# Patient Record
Sex: Male | Born: 1970 | Race: White | Marital: Single | Smoking: Current every day smoker
Health system: Southern US, Community
[De-identification: ages and names within clinical notes are randomized; demographics above are authoritative.]

---

## 2019-07-07 ENCOUNTER — Encounter: Payer: Self-pay | Admitting: Intensive Care

## 2019-07-07 ENCOUNTER — Emergency Department: Payer: BC Managed Care – PPO

## 2019-07-07 ENCOUNTER — Inpatient Hospital Stay
Admission: EM | Admit: 2019-07-07 | Discharge: 2019-07-10 | DRG: 390 | Disposition: A | Discharge status: Home / Self Care | Payer: BC Managed Care – PPO | Attending: Family Medicine | Admitting: Family Medicine

## 2019-07-07 ENCOUNTER — Other Ambulatory Visit: Payer: Self-pay

## 2019-07-07 DIAGNOSIS — Z20822 Contact with and (suspected) exposure to covid-19: Secondary | ICD-10-CM | POA: Diagnosis present

## 2019-07-07 DIAGNOSIS — K56609 Unspecified intestinal obstruction, unspecified as to partial versus complete obstruction: Principal | ICD-10-CM

## 2019-07-07 DIAGNOSIS — E876 Hypokalemia: Secondary | ICD-10-CM | POA: Diagnosis present

## 2019-07-07 DIAGNOSIS — R509 Fever, unspecified: Secondary | ICD-10-CM | POA: Diagnosis not present

## 2019-07-07 DIAGNOSIS — F17210 Nicotine dependence, cigarettes, uncomplicated: Secondary | ICD-10-CM | POA: Diagnosis present

## 2019-07-07 DIAGNOSIS — D72829 Elevated white blood cell count, unspecified: Secondary | ICD-10-CM | POA: Diagnosis present

## 2019-07-07 LAB — CBC
HCT: 49.9 % (ref 39.0–52.0)
Hemoglobin: 17 g/dL (ref 13.0–17.0)
MCH: 32.8 pg (ref 26.0–34.0)
MCHC: 34.1 g/dL (ref 30.0–36.0)
MCV: 96.1 fL (ref 80.0–100.0)
Platelets: 243 10*3/uL (ref 150–400)
RBC: 5.19 MIL/uL (ref 4.22–5.81)
RDW: 12 % (ref 11.5–15.5)
WBC: 2.7 10*3/uL — ABNORMAL LOW (ref 4.0–10.5)
nRBC: 0 % (ref 0.0–0.2)

## 2019-07-07 LAB — COMPREHENSIVE METABOLIC PANEL
ALT: 27 U/L (ref 0–44)
AST: 31 U/L (ref 15–41)
Albumin: 4 g/dL (ref 3.5–5.0)
Alkaline Phosphatase: 61 U/L (ref 38–126)
Anion gap: 12 (ref 5–15)
BUN: 18 mg/dL (ref 6–20)
CO2: 32 mmol/L (ref 22–32)
Calcium: 9.4 mg/dL (ref 8.9–10.3)
Chloride: 96 mmol/L — ABNORMAL LOW (ref 98–111)
Creatinine, Ser: 1 mg/dL (ref 0.61–1.24)
GFR calc Af Amer: >60 mL/min (ref >60)
GFR calc non Af Amer: >60 mL/min (ref >60)
Glucose, Bld: 145 mg/dL — ABNORMAL HIGH (ref 70–99)
Potassium: 3.8 mmol/L (ref 3.5–5.1)
Sodium: 140 mmol/L (ref 135–145)
Total Bilirubin: 0.8 mg/dL (ref 0.3–1.2)
Total Protein: 7.7 g/dL (ref 6.5–8.1)

## 2019-07-07 LAB — LIPASE, BLOOD: Lipase: 24 U/L (ref 11–51)

## 2019-07-07 MED ORDER — SODIUM CHLORIDE 0.9 % IV BOLUS
1000.0000 mL | Freq: Once | INTRAVENOUS | Status: AC
  Administered 2019-07-07: 1000 mL via INTRAVENOUS

## 2019-07-07 MED ORDER — ONDANSETRON 4 MG PO TBDP
4.0000 mg | ORAL_TABLET | Freq: Once | ORAL | Status: AC | PRN
  Administered 2019-07-07: 14:00:00 4 mg via ORAL
  Filled 2019-07-07: qty 1

## 2019-07-07 MED ORDER — LIDOCAINE VISCOUS HCL 2 % MT SOLN
15.0000 mL | Freq: Once | OROMUCOSAL | Status: AC
  Administered 2019-07-07: 15 mL via ORAL
  Filled 2019-07-07: qty 15

## 2019-07-07 MED ORDER — ACETAMINOPHEN 650 MG RE SUPP
325.0000 mg | Freq: Four times a day (QID) | RECTAL | Status: DC | PRN
  Filled 2019-07-07: qty 1

## 2019-07-07 MED ORDER — DEXTROSE-NACL 5-0.45 % IV SOLN: INTRAVENOUS | Status: DC

## 2019-07-07 MED ORDER — PANTOPRAZOLE SODIUM 40 MG IV SOLR
40.0000 mg | Freq: Two times a day (BID) | INTRAVENOUS | Status: DC
  Administered 2019-07-07 – 2019-07-10 (×6): 40 mg via INTRAVENOUS
  Filled 2019-07-07 (×6): qty 40

## 2019-07-07 MED ORDER — SODIUM CHLORIDE 0.9 % IV SOLN
INTRAVENOUS | Status: DC | PRN
  Administered 2019-07-07 – 2019-07-08 (×3): 250 mL via INTRAVENOUS

## 2019-07-07 MED ORDER — KETOROLAC TROMETHAMINE 30 MG/ML IJ SOLN
30.0000 mg | Freq: Four times a day (QID) | INTRAMUSCULAR | Status: DC | PRN
  Administered 2019-07-07 – 2019-07-08 (×2): 30 mg via INTRAVENOUS
  Filled 2019-07-07 (×2): qty 1

## 2019-07-07 MED ORDER — DICYCLOMINE HCL 10 MG PO CAPS
10.0000 mg | ORAL_CAPSULE | Freq: Once | ORAL | Status: AC
  Administered 2019-07-07: 10 mg via ORAL
  Filled 2019-07-07: qty 1

## 2019-07-07 MED ORDER — PIPERACILLIN-TAZOBACTAM 3.375 G IVPB
3.3750 g | Freq: Three times a day (TID) | INTRAVENOUS | Status: DC
  Administered 2019-07-07 – 2019-07-10 (×8): 3.375 g via INTRAVENOUS
  Filled 2019-07-07 (×9): qty 50

## 2019-07-07 MED ORDER — IOHEXOL 300 MG/ML  SOLN
100.0000 mL | Freq: Once | INTRAMUSCULAR | Status: AC | PRN
  Administered 2019-07-07: 100 mL via INTRAVENOUS

## 2019-07-07 MED ORDER — ENOXAPARIN SODIUM 40 MG/0.4ML ~~LOC~~ SOLN
40.0000 mg | SUBCUTANEOUS | Status: DC
  Administered 2019-07-07 – 2019-07-09 (×3): 40 mg via SUBCUTANEOUS
  Filled 2019-07-07 (×3): qty 0.4

## 2019-07-07 MED ORDER — ALUM & MAG HYDROXIDE-SIMETH 200-200-20 MG/5ML PO SUSP
30.0000 mL | Freq: Once | ORAL | Status: AC
  Administered 2019-07-07: 30 mL via ORAL
  Filled 2019-07-07: qty 30

## 2019-07-07 NOTE — H&P (Addendum)
History and Physical    Amjad Fikes SFK:[id number] DOB: [birth year] DOA: 07/07/2019  PCP: System, Pcp Not In (Confirm with patient/family/NH records and if not entered, this has to be entered at [facility] point of entry) Patient coming from: home  I have personally briefly reviewed patient's old medical records in [facility]  Chief Complaint: Abdominal pain, N/V  HPI: Julian Jennings is a 49 y.o. male with medical history significant of  presents the emergency department complaining of sharp midabdominal/periumbilical pain.  Patient states that the pain is intense, the worst he has ever had in his abdomen.  Patient states that he has been extremely nauseated, having emesis.  He has not been able to keep solids or liquids down in 3 days.  Patient states that he is having no flatulence or bowel movements in the last 4 days.  No history of chronic abdominal problems.  No history of colitis, Crohn's, IBS. No history of abdominal surgery or injury.  Emesis is nonbilious.  No identified blood. (For level 3, the HPI must include 4+ descriptors: Location, Quality, Severity, Duration, Timing, Context, modifying factors, associated signs/symptoms and/or status of 3+ chronic problems.)  (Please avoid self-populating past medical history here) (The initial 2-3 lines should be focused and good to copy and paste in the HPI section of the daily progress note).  ED Course: Hemodynamically stable. Labs unremarkable. Imaging reveals SBO distal small bowel right. NG placed with some relief of pain and distention. Surgical consult obtained. Patient referred to [facility] for medical management and admission  Review of Systems: As per HPI otherwise 10 point review of systems negative. Has had hiccups for several days. Unacceptable ROS statements: "10 systems reviewed," "Extensive" (without elaboration).  Acceptable ROS statements: "All others negative," "All others reviewed and are negative," and "All others unremarkable," with at  LEAST ONE ROS documented Can't double dip - if using for HPI can't use for ROS  History reviewed. No pertinent past medical history.  History reviewed. No pertinent surgical history.  Social Hx - Lives in [place]. 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 Has a brother in [place]   reports that he has been smoking cigarettes. His smokeless tobacco use includes chew. He reports previous alcohol use. He reports current drug use. Drug: Marijuana. Was smoking 12 ppd but has not smoked since coming to [place] a week ago. Drank 6-pack a day but has not had a drink for many days. Never had any problems with alcohol No Known Allergies  History reviewed. No pertinent family history. Unacceptable: Noncontributory, unremarkable, or negative. Acceptable: Family history reviewed and not pertinent (If you reviewed it)  Prior to Admission medications   Not on File    Physical Exam: Vitals:   07/07/19 1600 07/07/19 1700 07/07/19 1800 07/07/19 1927  BP: (!) 159/96 (!) 155/104 (!) 165/102 (!) 150/94  Pulse: 91 (!) 106 (!) 109 96  Resp:  17 18 14   Temp:      TempSrc:      SpO2: 98% 100% 98% 97%  Weight:      Height:        Constitutional: NAD, calm, comfortable Vitals:   07/07/19 1600 07/07/19 1700 07/07/19 1800 07/07/19 1927  BP: (!) 159/96 (!) 155/104 (!) 165/102 (!) 150/94  Pulse: 91 (!) 106 (!) 109 96  Resp:  17 18 14   Temp:      TempSrc:      SpO2: 98% 100% 98% 97%  Weight:  Height:       General  - WNWD man in no distress at time of exam. Eyes: PERRL, lids and conjunctivae normal ENMT: Mucous membranes are moist. Posterior pharynx clear of any exudate or lesions.Normal dentition.  Neck: normal, supple, no masses, no thyromegaly Respiratory: clear to auscultation bilaterally, no wheezing, no crackles. Normal respiratory effort. No accessory muscle use.  Cardiovascular: Regular rate and rhythm, no murmurs / rubs / gallops. No extremity edema. 2+ pedal pulses. No  carotid bruits.  Abdomen: Absent BS, mild diffuse  tenderness, no masses palpated. No hepatosplenomegaly. Musculoskeletal: no clubbing / cyanosis. No joint deformity upper and lower extremities. Good ROM, no contractures. Normal muscle tone.  Skin: no rashes, lesions, ulcers. No induration Neurologic: CN 2-12 grossly intact. Sensation intact, DTR normal. Strength 5/5 in all 4.  Psychiatric: Normal judgment and insight. Alert and oriented x 3. Normal mood.   (Anything < 9 systems with 2 bullets each down codes to level 1) (If patient refuses exam can't bill higher level) (Make sure to document decubitus ulcers present on admission -- if possible -- and whether patient has chronic indwelling catheter at time of admission)  Labs on Admission: I have personally reviewed following labs and imaging studies  CBC: Recent Labs  Lab 07/07/19 1410  WBC 2.7*  HGB 17.0  HCT 49.9  MCV 96.1  PLT 007   Basic Metabolic Panel: Recent Labs  Lab 07/07/19 1410  NA 140  K 3.8  CL 96*  CO2 32  GLUCOSE 145*  BUN 18  CREATININE 1.00  CALCIUM 9.4   GFR: Estimated Creatinine Clearance: 74 mL/min (by C-G formula based on SCr of 1 mg/dL). Liver Function Tests: Recent Labs  Lab 07/07/19 1410  AST 31  ALT 27  ALKPHOS 61  BILITOT 0.8  PROT 7.7  ALBUMIN 4.0   Recent Labs  Lab 07/07/19 1410  LIPASE 24   No results for input(s): AMMONIA in the last 168 hours. Coagulation Profile: No results for input(s): INR, PROTIME in the last 168 hours. Cardiac Enzymes: No results for input(s): CKTOTAL, CKMB, CKMBINDEX, TROPONINI in the last 168 hours. BNP (last 3 results) No results for input(s): PROBNP in the last 8760 hours. HbA1C: No results for input(s): HGBA1C in the last 72 hours. CBG: No results for input(s): GLUCAP in the last 168 hours. Lipid Profile: No results for input(s): CHOL, HDL, LDLCALC, TRIG, CHOLHDL, LDLDIRECT in the last 72 hours. Thyroid Function Tests: No results for  input(s): TSH, T4TOTAL, FREET4, T3FREE, THYROIDAB in the last 72 hours. Anemia Panel: No results for input(s): VITAMINB12, FOLATE, FERRITIN, TIBC, IRON, RETICCTPCT in the last 72 hours. Urine analysis: No results found for: COLORURINE, APPEARANCEUR, Anderson, Fallon, GLUCOSEU, HGBUR, BILIRUBINUR, KETONESUR, PROTEINUR, UROBILINOGEN, NITRITE, LEUKOCYTESUR  Radiological Exams on Admission: DG Abdomen 1 View  Result Date: 07/07/2019 CLINICAL DATA:  NG tube placement EXAM: ABDOMEN - 1 VIEW COMPARISON:  None. FINDINGS: The NG tube projects over the left upper quadrant in the region of the patient's gastric body or fundus. Dilated loops of small bowel are again noted consistent with the patient's small-bowel obstruction. IMPRESSION: 1. NG tube tip projects over the gastric body or fundus. 2. Small bowel obstruction. Electronically Signed   By: Constance Holster M.D.   On: 07/07/2019 19:25   CT ABDOMEN PELVIS W CONTRAST  Result Date: 07/07/2019 CLINICAL DATA:  Sharp abdominal pain and nausea with vomiting for 3 days. EXAM: CT ABDOMEN AND PELVIS WITH CONTRAST TECHNIQUE: Multidetector CT imaging of the abdomen  and pelvis was performed using the standard protocol following bolus administration of intravenous contrast. CONTRAST:  OMNIPAQUE IOHEXOL 300 MG/ML  SOLN COMPARISON:  None. FINDINGS: Lower Chest: No acute findings. Hepatobiliary: Tiny sub-cm low-attenuation lesion in the in medial segment of left lobe is too small to characterize but most likely represents a tiny cyst. No other liver lesions identified. Gallbladder is unremarkable. No evidence of biliary ductal dilatation. Pancreas:  No mass or inflammatory changes. Spleen: Within normal limits in size and appearance. Adrenals/Urinary Tract: No masses identified. Mild left renal parenchymal scarring noted. No evidence of hydronephrosis. Stomach/Bowel: Multiple moderately dilated fluid-filled small bowel loops are seen with transition point in the  right lower quadrant, consistent with distal small bowel obstruction. Mild small bowel wall thickening and enhancement in the region of the transition point is consistent with enteritis. This may be due to stricture or adhesion. No evidence of abscess. Minimal free fluid seen in pelvic cul-de-sac. Vascular/Lymphatic: No pathologically enlarged lymph nodes. Shotty sub-cm mesenteric lymph nodes in the right abdomen are likely reactive in etiology. No abdominal aortic aneurysm. Reproductive:  No mass or other significant abnormality. Other:  None. Musculoskeletal:  No suspicious bone lesions identified. IMPRESSION: 1. Distal small bowel obstruction, with transition point in the right lower quadrant. This may be due to stricture or adhesion. 2. Minimal free fluid in pelvic cul-de-sac. No evidence of abscess. Electronically Signed   By: Danae Orleans M.D.   On: 07/07/2019 17:08    EKG: Independently reviewed. No EKG on chart  Assessment/Plan Active Problems:   SBO (small bowel obstruction) (HCC)  (please populate well all problems here in Problem List. (For example, if patient is on BP meds at home and you resume or decide to hold them, it is a problem that needs to be her. Same for CAD, COPD, HLD and so on)   1. SBO - sponataneous SBO. No prior surgery, no GI history, no injury. He is basically a healthy man. Plan Med-surg admit  NPO  NG to low suction  IV PPI - for hiccups and stress ulcer prevention  IVF - D5 1/2NS  Ketorolac for pain mgt  Surgery to follow Addendum: patient developed Fever to 101.2 F. On reexam he had increased tenderness with guarding. Plan APAP suppository 325 mg q6 for fever  Zosyn 3.375mg  IV q 6 for potential intra-abdominal infection  DVT prophylaxis: lovenox3 (Lovenox/Heparin/SCD's/anticoagulated/None (if comfort care) Code Status: full code (Full/Partial (specify details) Family Communication: Brother Trey Paula [phone] - not available at time of admission (Specify name,  relationship. Do not write "discussed with patient". Specify tel # if discussed over the phone) Disposition Plan: home when medically stable (specify when and where you expect patient to be discharged) Consults called:  Surgery - Dr. Tonna Boehringer (with names) Admission status: inpatient (inpatient / obs / tele / medical floor / SDU)   Illene Regulus MD [facility] Pager [phone]  If 7PM-7AM, please contact night-coverage [email or web address] Password [facility]  07/07/2019, 8:37 PM

## 2019-07-07 NOTE — ED Notes (Signed)
Hospitalist at bedside 

## 2019-07-07 NOTE — ED Triage Notes (Signed)
Patient c/o abd cramping and emesis X3 days. Denies diarrhea.

## 2019-07-07 NOTE — Consult Note (Signed)
Subjective:   CC: Small bowel obstruction  HPI:  Julian Jennings is a 49 y.o. male who was consulted for issue above.  Symptoms were first noted 3 days ago. Pain is sharp, around the periumbilical area, improving now but still present..  Associated with nausea vomiting with obstipation, exacerbated by nothing specific.   Past Medical History: none reported  Past Surgical History: None  Family History: non-contributory  Social History:  reports that he has been smoking cigarettes. His smokeless tobacco use includes chew. He reports previous alcohol use. He reports current drug use. Drug: Marijuana.  Current Medications: none reported  Allergies:  Allergies as of 07/07/2019  . (No Known Allergies)    ROS:  General: Denies weight loss, weight gain, fatigue, fevers, chills, and night sweats. Eyes: Denies blurry vision, double vision, eye pain, itchy eyes, and tearing. Ears: Denies hearing loss, earache, and ringing in ears. Nose: Denies sinus pain, congestion, infections, runny nose, and nosebleeds. Mouth/throat: Denies hoarseness, sore throat, bleeding gums, and difficulty swallowing. Heart: Denies chest pain, palpitations, racing heart, irregular heartbeat, leg pain or swelling, and decreased activity tolerance. Respiratory: Denies breathing difficulty, shortness of breath, wheezing, cough, and sputum. GI: Denies change in appetite, heartburn, diarrhea, and blood in stool. GU: Denies difficulty urinating, pain with urinating, urgency, frequency, blood in urine. Musculoskeletal: Denies joint stiffness, pain, swelling, muscle weakness. Skin: Denies rash, itching, mass, tumors, sores, and boils Neurologic: Denies headache, fainting, dizziness, seizures, numbness, and tingling. Psychiatric: Denies depression, anxiety, difficulty sleeping, and memory loss. Endocrine: Denies heat or cold intolerance, and increased thirst or urination. Blood/lymph: Denies easy bruising, easy bruising, and  swollen glands     Objective:     BP (!) 155/104   Pulse (!) 106   Temp 99.8 F (37.7 C) (Oral)   Resp 17   Ht 5\' 4"  (1.626 m)   Wt 63.5 kg   SpO2 100%   BMI 24.03 kg/m   Constitutional :  alert, cooperative, appears stated age and no distress  Lymphatics/Throat:  no asymmetry, masses, or scars  Respiratory:  clear to auscultation bilaterally  Cardiovascular:  regular rate and rhythm  Gastrointestinal: soft, no guarding, some TTP RLQ.    Musculoskeletal: Steady movement  Skin: Cool and moist   Psychiatric: Normal affect, non-agitated, not confused       LABS:  CMP Latest Ref Rng & Units 07/07/2019  Glucose 70 - 99 mg/dL 07/09/2019)  BUN 6 - 20 mg/dL 18  Creatinine 409(W - 1.19 mg/dL 1.47  Sodium 8.29 - 562 mmol/L 140  Potassium 3.5 - 5.1 mmol/L 3.8  Chloride 98 - 111 mmol/L 96(L)  CO2 22 - 32 mmol/L 32  Calcium 8.9 - 10.3 mg/dL 9.4  Total Protein 6.5 - 8.1 g/dL 7.7  Total Bilirubin 0.3 - 1.2 mg/dL 0.8  Alkaline Phos 38 - 126 U/L 61  AST 15 - 41 U/L 31  ALT 0 - 44 U/L 27   CBC Latest Ref Rng & Units 07/07/2019  WBC 4.0 - 10.5 K/uL 2.7(L)  Hemoglobin 13.0 - 17.0 g/dL 07/09/2019  Hematocrit 86.5 - 52.0 % 49.9  Platelets 150 - 400 K/uL 243    RADS: CLINICAL DATA:  Sharp abdominal pain and nausea with vomiting for 3 days.  EXAM: CT ABDOMEN AND PELVIS WITH CONTRAST  TECHNIQUE: Multidetector CT imaging of the abdomen and pelvis was performed using the standard protocol following bolus administration of intravenous contrast.  CONTRAST:  78.4 OMNIPAQUE IOHEXOL 300 MG/ML  SOLN  COMPARISON:  None.  FINDINGS: Lower Chest: No acute findings.  Hepatobiliary: Tiny sub-cm low-attenuation lesion in the in medial segment of left lobe is too small to characterize but most likely represents a tiny cyst. No other liver lesions identified. Gallbladder is unremarkable. No evidence of biliary ductal dilatation.  Pancreas:  No mass or inflammatory changes.  Spleen:  Within normal limits in size and appearance.  Adrenals/Urinary Tract: No masses identified. Mild left renal parenchymal scarring noted. No evidence of hydronephrosis.  Stomach/Bowel: Multiple moderately dilated fluid-filled small bowel loops are seen with transition point in the right lower quadrant, consistent with distal small bowel obstruction. Mild small bowel wall thickening and enhancement in the region of the transition point is consistent with enteritis. This may be due to stricture or adhesion. No evidence of abscess. Minimal free fluid seen in pelvic cul-de-sac.  Vascular/Lymphatic: No pathologically enlarged lymph nodes. Shotty sub-cm mesenteric lymph nodes in the right abdomen are likely reactive in etiology. No abdominal aortic aneurysm.  Reproductive:  No mass or other significant abnormality.  Other:  None.  Musculoskeletal:  No suspicious bone lesions identified.  IMPRESSION: 1. Distal small bowel obstruction, with transition point in the right lower quadrant. This may be due to stricture or adhesion. 2. Minimal free fluid in pelvic cul-de-sac. No evidence of abscess.   Electronically Signed   By: Marlaine Hind M.D.   On: 07/07/2019 17:08 Assessment:   Small bowel obstruction  Plan:   Patient vitals stable, looking relatively comfortable on exam unsure what exact cause of the obstruction is since patient does not have any abdominal surgical history.  Discussed CT scan with reading radiologist and at this point CT does not have any signs of a possible mass as cause of obstruction.  For now we will treat him symptomatically.  Recommend NG tube decompression, and small bowel obstruction protocol with a small bowel follow-through to see if symptoms resolve.  Patient may need potential work-up down the road to further look into the cause of this obstruction.  Thank you for this consultation.

## 2019-07-07 NOTE — ED Provider Notes (Signed)
[facility] Emergency Department Provider Note  ____________________________________________  Time seen: Approximately 3:22 PM  I have reviewed the triage vital signs and the nursing notes.   HISTORY  Chief Complaint Abdominal Pain and Emesis    HPI Julian Jennings is a 49 y.o. male who presents the emergency department complaining of sharp midabdominal/periumbilical pain.  Patient states that the pain is intense, the worst he has ever had in his abdomen.  Patient states that he has been extremely nauseated, having emesis.  He has not been able to keep solids or liquids down in 3 days.  Patient states that he is having no flatulence or bowel movements in the last 4 days.  No history of chronic abdominal problems.  No history of colitis, Crohn's, IBS.  Emesis is nonbilious.  No identified blood.         History reviewed. No pertinent past medical history.  There are no problems to display for this patient.   History reviewed. No pertinent surgical history.  Prior to Admission medications   Not on File    Allergies Patient has no known allergies.  History reviewed. No pertinent family history.  Social History Social History   Tobacco Use  . Smoking status: Current Every Day Smoker    Types: Cigarettes  . Smokeless tobacco: Current User    Types: Chew  Substance Use Topics  . Alcohol use: Not Currently  . Drug use: Yes    Types: Marijuana     Review of Systems  Constitutional: No fever/chills Eyes: No visual changes. No discharge ENT: No upper respiratory complaints. Cardiovascular: no chest pain. Respiratory: no cough. No SOB. Gastrointestinal: Sharp abdominal pain with inability to keep solids and liquids down..  No diarrhea.  No constipation. Genitourinary: Negative for dysuria. No hematuria Musculoskeletal: Negative for musculoskeletal pain. Skin: Negative for rash, abrasions, lacerations, ecchymosis. Neurological: Negative for  headaches, focal weakness or numbness. 10-point ROS otherwise negative.  ____________________________________________   PHYSICAL EXAM:  VITAL SIGNS: ED Triage Vitals  Enc Vitals Group     BP 07/07/19 1404 (!) 164/90     Pulse Rate 07/07/19 1404 (!) 102     Resp 07/07/19 1404 16     Temp 07/07/19 1404 99.8 F (37.7 C)     Temp Source 07/07/19 1404 Oral     SpO2 07/07/19 1404 100 %     Weight 07/07/19 1405 140 lb (63.5 kg)     Height 07/07/19 1405 5\' 4"  (1.626 m)     Head Circumference --      Peak Flow --      Pain Score 07/07/19 1405 7     Pain Loc --      Pain Edu? --      Excl. in GC? --      Constitutional: Alert and oriented. Well appearing and in no acute distress. Eyes: Conjunctivae are normal. PERRL. EOMI. Head: Atraumatic. ENT:      Ears:       Nose: No congestion/rhinnorhea.      Mouth/Throat: Mucous membranes are moist.  Neck: No stridor.    Cardiovascular: Normal rate, regular rhythm. Normal S1 and S2.  Good peripheral circulation. Respiratory: Normal respiratory effort without tachypnea or retractions. Lungs CTAB. Good air entry to the bases with no decreased or absent breath sounds. Gastrointestinal: No visible external abdominal wall findings. Bowel sounds 4 quadrants.  Patient does have diffuse mid abdominal tenderness with no point specific tenderness.  This is diffusely in the middle  of all 4 quadrants without any 1 quadrant being more tender than another.. No guarding or rigidity. No palpable masses. No distention. No CVA tenderness. Musculoskeletal: Full range of motion to all extremities. No gross deformities appreciated. Neurologic:  Normal speech and language. No gross focal neurologic deficits are appreciated.  Skin:  Skin is warm, dry and intact. No rash noted. Psychiatric: Mood and affect are normal. Speech and behavior are normal. Patient exhibits appropriate insight and judgement.   ____________________________________________   LABS (all  labs ordered are listed, but only abnormal results are displayed)  Labs Reviewed  COMPREHENSIVE METABOLIC PANEL - Abnormal; Notable for the following components:      Result Value   Chloride 96 (*)    Glucose, Bld 145 (*)    All other components within normal limits  CBC - Abnormal; Notable for the following components:   WBC 2.7 (*)    All other components within normal limits  SARS CORONAVIRUS 2 (TAT 6-24 HRS)  LIPASE, BLOOD  URINALYSIS, COMPLETE (UACMP) WITH MICROSCOPIC   ____________________________________________  EKG   ____________________________________________  RADIOLOGY I personally viewed and evaluated these images as part of my medical decision making, as well as reviewing the written report by the radiologist.  CT ABDOMEN PELVIS W CONTRAST  Result Date: 07/07/2019 CLINICAL DATA:  Sharp abdominal pain and nausea with vomiting for 3 days. EXAM: CT ABDOMEN AND PELVIS WITH CONTRAST TECHNIQUE: Multidetector CT imaging of the abdomen and pelvis was performed using the standard protocol following bolus administration of intravenous contrast. CONTRAST:  OMNIPAQUE IOHEXOL 300 MG/ML  SOLN COMPARISON:  None. FINDINGS: Lower Chest: No acute findings. Hepatobiliary: Tiny sub-cm low-attenuation lesion in the in medial segment of left lobe is too small to characterize but most likely represents a tiny cyst. No other liver lesions identified. Gallbladder is unremarkable. No evidence of biliary ductal dilatation. Pancreas:  No mass or inflammatory changes. Spleen: Within normal limits in size and appearance. Adrenals/Urinary Tract: No masses identified. Mild left renal parenchymal scarring noted. No evidence of hydronephrosis. Stomach/Bowel: Multiple moderately dilated fluid-filled small bowel loops are seen with transition point in the right lower quadrant, consistent with distal small bowel obstruction. Mild small bowel wall thickening and enhancement in the region of the transition  point is consistent with enteritis. This may be due to stricture or adhesion. No evidence of abscess. Minimal free fluid seen in pelvic cul-de-sac. Vascular/Lymphatic: No pathologically enlarged lymph nodes. Shotty sub-cm mesenteric lymph nodes in the right abdomen are likely reactive in etiology. No abdominal aortic aneurysm. Reproductive:  No mass or other significant abnormality. Other:  None. Musculoskeletal:  No suspicious bone lesions identified. IMPRESSION: 1. Distal small bowel obstruction, with transition point in the right lower quadrant. This may be due to stricture or adhesion. 2. Minimal free fluid in pelvic cul-de-sac. No evidence of abscess. Electronically Signed   By: Danae Orleans M.D.   On: 07/07/2019 17:08    ____________________________________________    PROCEDURES  Procedure(s) performed:    Procedures    Medications  ondansetron (ZOFRAN-ODT) disintegrating tablet 4 mg (4 mg Oral Given 07/07/19 1409)  sodium chloride 0.9 % bolus 1,000 mL (0 mLs Intravenous Stopped 07/07/19 1804)  dicyclomine (BENTYL) capsule 10 mg (10 mg Oral Given 07/07/19 1624)  alum & mag hydroxide-simeth (MAALOX/MYLANTA) 200-200-20 MG/5ML suspension 30 mL (30 mLs Oral Given 07/07/19 1624)    And  lidocaine (XYLOCAINE) 2 % viscous mouth solution 15 mL (15 mLs Oral Given 07/07/19 1624)  iohexol (OMNIPAQUE) 300  MG/ML solution 100 mL (100 mLs Intravenous Contrast Given 07/07/19 1645)     ____________________________________________   INITIAL IMPRESSION / ASSESSMENT AND PLAN / ED COURSE  Pertinent labs & imaging results that were available during my care of the patient were reviewed by me and considered in my medical decision making (see chart for details).  Review of the [place] CSRS was performed in accordance of the [place] prior to dispensing any controlled drugs.           Patient's diagnosis is consistent with small bowel obstruction.  Patient presented to the emergency department complaining of  diffuse/periumbilical type pain.  Pain has been severe.  Patient has nausea, emesis.  He states that he cannot keep solids or liquids down.  Within 15 minutes of eating or drinking patient does have emesis.  No bowel movements in 4 days.  Patient is not passing any flatulence.  Concern on exam for small bowel obstruction given patient's symptoms.  This is confirmed on CT scan.  Labs are reassuring.  I discussed the case with surgery who advises that they will see the patient but patient should be admitted to the medicine service.  NG tube placed in the emergency department.  Will contact hospital service for admission.. Patient will be admitted to the hospital service, but surgery has evaluated the patient in the emergency department.    ____________________________________________  FINAL CLINICAL IMPRESSION(S) / ED DIAGNOSES  Final diagnoses:  SBO (small bowel obstruction) ([place])      NEW MEDICATIONS STARTED DURING THIS VISIT:  ED Discharge Orders    None          This chart was dictated using voice recognition software/Dragon. Despite best efforts to proofread, errors can occur which can change the meaning. Any change was purely unintentional.    Darletta Moll, PA-C 07/07/19 1917    Drenda Freeze, MD 07/07/19 2142

## 2019-07-08 ENCOUNTER — Inpatient Hospital Stay: Payer: BC Managed Care – PPO

## 2019-07-08 DIAGNOSIS — K56609 Unspecified intestinal obstruction, unspecified as to partial versus complete obstruction: Principal | ICD-10-CM

## 2019-07-08 LAB — CBC WITH DIFFERENTIAL/PLATELET
Abs Immature Granulocytes: 0.01 10*3/uL (ref 0.00–0.07)
Abs Immature Granulocytes: 0.02 10*3/uL (ref 0.00–0.07)
Basophils Absolute: 0 10*3/uL (ref 0.0–0.1)
Basophils Absolute: 0 10*3/uL (ref 0.0–0.1)
Basophils Relative: 1 %
Basophils Relative: 1 %
Eosinophils Absolute: 0 10*3/uL (ref 0.0–0.5)
Eosinophils Absolute: 0 10*3/uL (ref 0.0–0.5)
Eosinophils Relative: 0 %
Eosinophils Relative: 0 %
HCT: 42 % (ref 39.0–52.0)
HCT: 41.3 % (ref 39.0–52.0)
Hemoglobin: 14.4 g/dL (ref 13.0–17.0)
Hemoglobin: 14.1 g/dL (ref 13.0–17.0)
Immature Granulocytes: 1 %
Immature Granulocytes: 1 %
Lymphocytes Relative: 17 %
Lymphocytes Relative: 17 %
Lymphs Abs: 0.3 10*3/uL — ABNORMAL LOW (ref 0.7–4.0)
Lymphs Abs: 0.4 10*3/uL — ABNORMAL LOW (ref 0.7–4.0)
MCH: 33.5 pg (ref 26.0–34.0)
MCH: 32.9 pg (ref 26.0–34.0)
MCHC: 34.3 g/dL (ref 30.0–36.0)
MCHC: 34.1 g/dL (ref 30.0–36.0)
MCV: 97.7 fL (ref 80.0–100.0)
MCV: 96.3 fL (ref 80.0–100.0)
Monocytes Absolute: 0.2 10*3/uL (ref 0.1–1.0)
Monocytes Absolute: 0.4 10*3/uL (ref 0.1–1.0)
Monocytes Relative: 14 %
Monocytes Relative: 16 %
Neutro Abs: 1 10*3/uL — ABNORMAL LOW (ref 1.7–7.7)
Neutro Abs: 1.5 10*3/uL — ABNORMAL LOW (ref 1.7–7.7)
Neutrophils Relative %: 67 %
Neutrophils Relative %: 65 %
Platelets: 189 10*3/uL (ref 150–400)
Platelets: 177 10*3/uL (ref 150–400)
RBC: 4.3 MIL/uL (ref 4.22–5.81)
RBC: 4.29 MIL/uL (ref 4.22–5.81)
RDW: 12.4 % (ref 11.5–15.5)
RDW: 12.3 % (ref 11.5–15.5)
Smear Review: NORMAL
Smear Review: NORMAL
WBC: 1.7 10*3/uL — ABNORMAL LOW (ref 4.0–10.5)
WBC: 2.2 10*3/uL — ABNORMAL LOW (ref 4.0–10.5)
nRBC: 0 % (ref 0.0–0.2)
nRBC: 0 % (ref 0.0–0.2)

## 2019-07-08 LAB — URINALYSIS, COMPLETE (UACMP) WITH MICROSCOPIC
Bacteria, UA: NONE SEEN
Bilirubin Urine: NEGATIVE
Glucose, UA: NEGATIVE mg/dL
Hgb urine dipstick: NEGATIVE
Ketones, ur: 5 mg/dL — AB
Leukocytes,Ua: NEGATIVE
Nitrite: NEGATIVE
Protein, ur: 100 mg/dL — AB
Specific Gravity, Urine: 1.034 — ABNORMAL HIGH (ref 1.005–1.030)
pH: 6 (ref 5.0–8.0)

## 2019-07-08 LAB — HIV ANTIBODY (ROUTINE TESTING W REFLEX): HIV Screen 4th Generation wRfx: NONREACTIVE

## 2019-07-08 LAB — BASIC METABOLIC PANEL
Anion gap: 10 (ref 5–15)
BUN: 18 mg/dL (ref 6–20)
CO2: 31 mmol/L (ref 22–32)
Calcium: 8 mg/dL — ABNORMAL LOW (ref 8.9–10.3)
Chloride: 96 mmol/L — ABNORMAL LOW (ref 98–111)
Creatinine, Ser: 1.08 mg/dL (ref 0.61–1.24)
GFR calc Af Amer: >60 mL/min (ref >60)
GFR calc non Af Amer: >60 mL/min (ref >60)
Glucose, Bld: 120 mg/dL — ABNORMAL HIGH (ref 70–99)
Potassium: 2.7 mmol/L — CL (ref 3.5–5.1)
Sodium: 137 mmol/L (ref 135–145)

## 2019-07-08 LAB — MAGNESIUM
Magnesium: 1.6 mg/dL — ABNORMAL LOW (ref 1.7–2.4)
Magnesium: 1.7 mg/dL (ref 1.7–2.4)

## 2019-07-08 LAB — SARS CORONAVIRUS 2 (TAT 6-24 HRS): SARS Coronavirus 2: NEGATIVE

## 2019-07-08 LAB — POTASSIUM
Potassium: 2.8 mmol/L — ABNORMAL LOW (ref 3.5–5.1)
Potassium: 3.2 mmol/L — ABNORMAL LOW (ref 3.5–5.1)

## 2019-07-08 MED ORDER — TRAZODONE HCL 50 MG PO TABS
50.0000 mg | ORAL_TABLET | Freq: Every evening | ORAL | Status: DC | PRN
  Administered 2019-07-08: 50 mg via ORAL
  Filled 2019-07-08: qty 1

## 2019-07-08 MED ORDER — SODIUM CHLORIDE 0.9 % IV SOLN: 12.5000 mg | Freq: Once | INTRAVENOUS | Status: DC

## 2019-07-08 MED ORDER — MAGNESIUM SULFATE 2 GM/50ML IV SOLN: 2.0000 g | Freq: Once | INTRAVENOUS | Status: DC

## 2019-07-08 MED ORDER — POTASSIUM CHLORIDE 10 MEQ/100ML IV SOLN
10.0000 meq | INTRAVENOUS | Status: AC
  Administered 2019-07-08 (×6): 10 meq via INTRAVENOUS
  Filled 2019-07-08 (×6): qty 100

## 2019-07-08 MED ORDER — MAGNESIUM SULFATE 2 GM/50ML IV SOLN
2.0000 g | Freq: Once | INTRAVENOUS | Status: AC
  Administered 2019-07-08: 2 g via INTRAVENOUS
  Filled 2019-07-08: qty 50

## 2019-07-08 NOTE — Plan of Care (Signed)
Continuing with plan of care. 

## 2019-07-08 NOTE — Progress Notes (Signed)
Subjective:  CC: Julian Jennings is a 49 y.o. male  Hospital stay day 1,   SBO  HPI: Developed fever overnight.  Started on abx. Symptoms improved, reports a BM.  ROS:  General: Denies weight loss, weight gain, fatigue, fevers, chills, and night sweats. Heart: Denies chest pain, palpitations, racing heart, irregular heartbeat, leg pain or swelling, and decreased activity tolerance. Respiratory: Denies breathing difficulty, shortness of breath, wheezing, cough, and sputum. GI: Denies change in appetite, heartburn, nausea, vomiting, constipation, diarrhea, and blood in stool. GU: Denies difficulty urinating, pain with urinating, urgency, frequency, blood in urine.   Objective:   Temp:  [99.4 F (37.4 C)-101.2 F (38.4 C)] 99.8 F (37.7 C) (01/31 1300) Pulse Rate:  [85-109] 91 (01/31 1239) Resp:  [12-24] 16 (01/31 1239) BP: (118-165)/(71-104) 118/71 (01/31 1239) SpO2:  [97 %-100 %] 98 % (01/31 1239) Weight:  [63.5 kg] 63.5 kg (01/30 1405)     Height: 5\' 4"  (162.6 cm) Weight: 63.5 kg BMI (Calculated): 24.02   Intake/Output this shift:   Intake/Output Summary (Last 24 hours) at 07/08/2019 1404 Last data filed at 07/08/2019 1100 Gross per 24 hour  Intake 1484.74 ml  Output 300 ml  Net 1184.74 ml    Constitutional :  alert, cooperative, appears stated age and no distress  Respiratory:  clear to auscultation bilaterally  Cardiovascular:  regular rate and rhythm  Gastrointestinal: soft, no guarding, still slightly distended but no TTP.   Skin: Cool and moist.   Psychiatric: Normal affect, non-agitated, not confused       LABS:  CMP Latest Ref Rng & Units 07/08/2019 07/08/2019 07/07/2019  Glucose 70 - 99 mg/dL - 07/09/2019) 242(P)  BUN 6 - 20 mg/dL - 18 18  Creatinine 536(R - 1.24 mg/dL - 4.43 1.54  Sodium 0.08 - 145 mmol/L - 137 140  Potassium 3.5 - 5.1 mmol/L 2.8(L) 2.7(LL) 3.8  Chloride 98 - 111 mmol/L - 96(L) 96(L)  CO2 22 - 32 mmol/L - 31 32  Calcium 8.9 - 10.3 mg/dL - 8.0(L) 9.4   Total Protein 6.5 - 8.1 g/dL - - 7.7  Total Bilirubin 0.3 - 1.2 mg/dL - - 0.8  Alkaline Phos 38 - 126 U/L - - 61  AST 15 - 41 U/L - - 31  ALT 0 - 44 U/L - - 27   CBC Latest Ref Rng & Units 07/08/2019 07/07/2019  WBC 4.0 - 10.5 K/uL 1.7(L) 2.7(L)  Hemoglobin 13.0 - 17.0 g/dL 07/09/2019 19.5  Hematocrit 09.3 - 52.0 % 42.0 49.9  Platelets 150 - 400 K/uL 189 243    RADS: CLINICAL DATA:  Patient with abdominal cramping.  EXAM: ABDOMEN - 2 VIEW  COMPARISON:  Abdominal radiograph 130 2021  FINDINGS: Enteric tube tip and side-port project over the stomach. Lung bases are clear. Redemonstrated multiple gaseous distended loops of small bowel within the central abdomen with differential air-fluid levels on upright imaging. No free intraperitoneal air. Contrast material within the urinary bladder. Osseous structures unremarkable.  IMPRESSION: Findings compatible with small-bowel obstruction.   Electronically Signed   By: 2022 M.D.   On: 07/08/2019 08:14 Assessment:   SBO, inflammatory vs infectious stricturing of SB at transition point? Cont abx for possible infection and decreasing leukocytosis. Pt clinically looks better but still has slight distention on exam as well as on xray.  He did report a BM, so will do a clamp trial and check residual to and see how he does.

## 2019-07-08 NOTE — Progress Notes (Addendum)
Subjective: On NGT suction.  Patient had fever with temperature of 101 F around noontime today.  Had a small bowel movement this morning.  Objective: Vital signs in last 24 hours: Temp:  [99.4 F (37.4 C)-101.2 F (38.4 C)] 101 F (38.3 C) (01/31 1239) Pulse Rate:  [85-109] 91 (01/31 1239) Resp:  [12-24] 16 (01/31 1239) BP: (118-165)/(71-104) 118/71 (01/31 1239) SpO2:  [97 %-100 %] 98 % (01/31 1239) Weight:  [63.5 kg] 63.5 kg (01/30 1405)  Intake/Output from previous day: 01/30 0701 - 01/31 0700 In: 1484.7 [I.V.:434.7] Out: 150 [Urine:150] Intake/Output this shift: Total I/O In: -  Out: 150 [Emesis/NG output:150]  General  -mild distress with NG tube on Eyes: PERRL, lids and conjunctivae normal ENMT: Mucous membranes are moist. Posterior pharynx clear of any exudate or lesions.Normal dentition.  Neck: normal, supple, no masses, no thyromegaly Respiratory: clear to auscultation bilaterally, no wheezing, no crackles. Normal respiratory effort. No accessory muscle use.  Cardiovascular: Regular rate and rhythm, no murmurs / rubs / gallops. No extremity edema. 2+ pedal pulses. No carotid bruits.  Abdomen:  Hypoactive BS, mild  tenderness around the lower abdomen and surgical site. no masses palpated. No hepatosplenomegaly.  Musculoskeletal: no clubbing / cyanosis. No joint deformity upper and lower extremities. Good ROM, no contractures. Normal muscle tone.  Skin: no rashes, lesions, ulcers. No induration Neurologic: CN 2-12 grossly intact. Sensation intact, DTR normal. Strength 5/5 in all 4.  Psychiatric: Normal judgment and insight. Alert and oriented x 3. Normal mood.   Results for orders placed or performed during the hospital encounter of 07/07/19 (from the past 24 hour(s))  Lipase, blood     Status: None   Collection Time: 07/07/19  2:10 PM  Result Value Ref Range   Lipase 24 11 - 51 U/L  Comprehensive metabolic panel     Status: Abnormal   Collection Time: 07/07/19  2:10  PM  Result Value Ref Range   Sodium 140 135 - 145 mmol/L   Potassium 3.8 3.5 - 5.1 mmol/L   Chloride 96 (L) 98 - 111 mmol/L   CO2 32 22 - 32 mmol/L   Glucose, Bld 145 (H) 70 - 99 mg/dL   BUN 18 6 - 20 mg/dL   Creatinine, Ser 1.61 0.61 - 1.24 mg/dL   Calcium 9.4 8.9 - 09.6 mg/dL   Total Protein 7.7 6.5 - 8.1 g/dL   Albumin 4.0 3.5 - 5.0 g/dL   AST 31 15 - 41 U/L   ALT 27 0 - 44 U/L   Alkaline Phosphatase 61 38 - 126 U/L   Total Bilirubin 0.8 0.3 - 1.2 mg/dL   GFR calc non Af Amer >60 >60 mL/min   GFR calc Af Amer >60 >60 mL/min   Anion gap 12 5 - 15  CBC     Status: Abnormal   Collection Time: 07/07/19  2:10 PM  Result Value Ref Range   WBC 2.7 (L) 4.0 - 10.5 K/uL   RBC 5.19 4.22 - 5.81 MIL/uL   Hemoglobin 17.0 13.0 - 17.0 g/dL   HCT 04.5 40.9 - 81.1 %   MCV 96.1 80.0 - 100.0 fL   MCH 32.8 26.0 - 34.0 pg   MCHC 34.1 30.0 - 36.0 g/dL   RDW 91.4 78.2 - 95.6 %   Platelets 243 150 - 400 K/uL   nRBC 0.0 0.0 - 0.2 %  SARS CORONAVIRUS 2 (TAT 6-24 HRS) Nasopharyngeal Nasopharyngeal Swab     Status: None   Collection Time:  07/07/19  6:21 PM   Specimen: Nasopharyngeal Swab  Result Value Ref Range   SARS Coronavirus 2 NEGATIVE NEGATIVE  HIV Antibody (routine testing w rflx)     Status: None   Collection Time: 07/07/19 10:58 PM  Result Value Ref Range   HIV Screen 4th Generation wRfx NON REACTIVE NON REACTIVE  Basic metabolic panel     Status: Abnormal   Collection Time: 07/08/19  4:20 AM  Result Value Ref Range   Sodium 137 135 - 145 mmol/L   Potassium 2.7 (LL) 3.5 - 5.1 mmol/L   Chloride 96 (L) 98 - 111 mmol/L   CO2 31 22 - 32 mmol/L   Glucose, Bld 120 (H) 70 - 99 mg/dL   BUN 18 6 - 20 mg/dL   Creatinine, Ser 1.08 0.61 - 1.24 mg/dL   Calcium 8.0 (L) 8.9 - 10.3 mg/dL   GFR calc non Af Amer >60 >60 mL/min   GFR calc Af Amer >60 >60 mL/min   Anion gap 10 5 - 15  CBC with Differential/Platelet     Status: Abnormal   Collection Time: 07/08/19  4:21 AM  Result Value Ref Range    WBC 1.7 (L) 4.0 - 10.5 K/uL   RBC 4.30 4.22 - 5.81 MIL/uL   Hemoglobin 14.4 13.0 - 17.0 g/dL   HCT 42.0 39.0 - 52.0 %   MCV 97.7 80.0 - 100.0 fL   MCH 33.5 26.0 - 34.0 pg   MCHC 34.3 30.0 - 36.0 g/dL   RDW 12.4 11.5 - 15.5 %   Platelets 189 150 - 400 K/uL   nRBC 0.0 0.0 - 0.2 %   Neutrophils Relative % 67 %   Neutro Abs 1.0 (L) 1.7 - 7.7 K/uL   Lymphocytes Relative 17 %   Lymphs Abs 0.3 (L) 0.7 - 4.0 K/uL   Monocytes Relative 14 %   Monocytes Absolute 0.2 0.1 - 1.0 K/uL   Eosinophils Relative 0 %   Eosinophils Absolute 0.0 0.0 - 0.5 K/uL   Basophils Relative 1 %   Basophils Absolute 0.0 0.0 - 0.1 K/uL   WBC Morphology MORPHOLOGY UNREMARKABLE    RBC Morphology MORPHOLOGY UNREMARKABLE    Smear Review Normal platelet morphology    Immature Granulocytes 1 %   Abs Immature Granulocytes 0.01 0.00 - 0.07 K/uL  Potassium     Status: Abnormal   Collection Time: 07/08/19  4:21 AM  Result Value Ref Range   Potassium 2.8 (L) 3.5 - 5.1 mmol/L  Magnesium     Status: Abnormal   Collection Time: 07/08/19  4:21 AM  Result Value Ref Range   Magnesium 1.6 (L) 1.7 - 2.4 mg/dL    Studies/Results: DG Abdomen 1 View  Result Date: 07/07/2019 CLINICAL DATA:  NG tube placement EXAM: ABDOMEN - 1 VIEW COMPARISON:  None. FINDINGS: The NG tube projects over the left upper quadrant in the region of the patient's gastric body or fundus. Dilated loops of small bowel are again noted consistent with the patient's small-bowel obstruction. IMPRESSION: 1. NG tube tip projects over the gastric body or fundus. 2. Small bowel obstruction. Electronically Signed   By: Constance Holster M.D.   On: 07/07/2019 19:25   CT ABDOMEN PELVIS W CONTRAST  Result Date: 07/07/2019 CLINICAL DATA:  Sharp abdominal pain and nausea with vomiting for 3 days. EXAM: CT ABDOMEN AND PELVIS WITH CONTRAST TECHNIQUE: Multidetector CT imaging of the abdomen and pelvis was performed using the standard protocol following bolus  administration of intravenous  contrast. CONTRAST:  OMNIPAQUE IOHEXOL 300 MG/ML  SOLN COMPARISON:  None. FINDINGS: Lower Chest: No acute findings. Hepatobiliary: Tiny sub-cm low-attenuation lesion in the in medial segment of left lobe is too small to characterize but most likely represents a tiny cyst. No other liver lesions identified. Gallbladder is unremarkable. No evidence of biliary ductal dilatation. Pancreas:  No mass or inflammatory changes. Spleen: Within normal limits in size and appearance. Adrenals/Urinary Tract: No masses identified. Mild left renal parenchymal scarring noted. No evidence of hydronephrosis. Stomach/Bowel: Multiple moderately dilated fluid-filled small bowel loops are seen with transition point in the right lower quadrant, consistent with distal small bowel obstruction. Mild small bowel wall thickening and enhancement in the region of the transition point is consistent with enteritis. This may be due to stricture or adhesion. No evidence of abscess. Minimal free fluid seen in pelvic cul-de-sac. Vascular/Lymphatic: No pathologically enlarged lymph nodes. Shotty sub-cm mesenteric lymph nodes in the right abdomen are likely reactive in etiology. No abdominal aortic aneurysm. Reproductive:  No mass or other significant abnormality. Other:  None. Musculoskeletal:  No suspicious bone lesions identified. IMPRESSION: 1. Distal small bowel obstruction, with transition point in the right lower quadrant. This may be due to stricture or adhesion. 2. Minimal free fluid in pelvic cul-de-sac. No evidence of abscess. Electronically Signed   By: Danae Orleans M.D.   On: 07/07/2019 17:08   DG Abd 2 Views  Result Date: 07/08/2019 CLINICAL DATA:  Patient with abdominal cramping. EXAM: ABDOMEN - 2 VIEW COMPARISON:  Abdominal radiograph 130 2021 FINDINGS: Enteric tube tip and side-port project over the stomach. Lung bases are clear. Redemonstrated multiple gaseous distended loops of small bowel within  the central abdomen with differential air-fluid levels on upright imaging. No free intraperitoneal air. Contrast material within the urinary bladder. Osseous structures unremarkable. IMPRESSION: Findings compatible with small-bowel obstruction. Electronically Signed   By: Annia Belt M.D.   On: 07/08/2019 08:14    Scheduled Meds: . enoxaparin (LOVENOX) injection  40 mg Subcutaneous Q24H  . pantoprazole (PROTONIX) IV  40 mg Intravenous Q12H   Continuous Infusions: . sodium chloride 250 mL (07/08/19 0650)  . dextrose 5 % and 0.45% NaCl 75 mL/hr at 07/08/19 0620  . piperacillin-tazobactam 3.375 g (07/08/19 1249)   PRN Meds:sodium chloride, acetaminophen, ketorolac  Assessment/Plan:  1. SBO -has had a bowel movement this morning -  sponataneous SBO. No prior surgery, no GI history, no injury. He is basically a healthy man. -Continue to follow surgery recommendation: Clamping NG tube and may start diet per surgery  - Cont IV PPI - for hiccups and stress ulcer prevention    - Cont VF - D5 1/2NS   - Cont  Ketorolac and Tylenol PR PRN for pain mgt -Pressure surgery recommendation - Zosyn 3.375mg  IV q 6 for potential intra-abdominal infection - BCx X 2 negative (ordered on 07/08/19)    Electrolyte imbalance: Hypokalemic and hypomagnesemic -Currently receiving replacement -Repeat lab  DVT prophylaxis: lovenox  Code Status: full code   Family Communication: Brother Julian Jennings [phone]  Disposition Plan: home when medically stable  Admission status: inpatient     LOS: 1 day   Julian Jennings Harold Hedge

## 2019-07-09 ENCOUNTER — Inpatient Hospital Stay: Payer: BC Managed Care – PPO

## 2019-07-09 LAB — MAGNESIUM: Magnesium: 2.4 mg/dL (ref 1.7–2.4)

## 2019-07-09 LAB — CBC
HCT: 41.3 % (ref 39.0–52.0)
Hemoglobin: 14.4 g/dL (ref 13.0–17.0)
MCH: 33.4 pg (ref 26.0–34.0)
MCHC: 34.9 g/dL (ref 30.0–36.0)
MCV: 95.8 fL (ref 80.0–100.0)
Platelets: 192 10*3/uL (ref 150–400)
RBC: 4.31 MIL/uL (ref 4.22–5.81)
RDW: 12.1 % (ref 11.5–15.5)
WBC: 4.8 10*3/uL (ref 4.0–10.5)
nRBC: 0 % (ref 0.0–0.2)

## 2019-07-09 LAB — BASIC METABOLIC PANEL
Anion gap: 10 (ref 5–15)
BUN: 17 mg/dL (ref 6–20)
CO2: 25 mmol/L (ref 22–32)
Calcium: 7.6 mg/dL — ABNORMAL LOW (ref 8.9–10.3)
Chloride: 99 mmol/L (ref 98–111)
Creatinine, Ser: 1.12 mg/dL (ref 0.61–1.24)
GFR calc Af Amer: >60 mL/min (ref >60)
GFR calc non Af Amer: >60 mL/min (ref >60)
Glucose, Bld: 119 mg/dL — ABNORMAL HIGH (ref 70–99)
Potassium: 3.1 mmol/L — ABNORMAL LOW (ref 3.5–5.1)
Sodium: 134 mmol/L — ABNORMAL LOW (ref 135–145)

## 2019-07-09 MED ORDER — KCL IN DEXTROSE-NACL 40-5-0.45 MEQ/L-%-% IV SOLN
INTRAVENOUS | Status: DC
  Filled 2019-07-09 (×3): qty 1000

## 2019-07-09 MED ORDER — DIATRIZOATE MEGLUMINE & SODIUM 66-10 % PO SOLN
90.0000 mL | Freq: Once | ORAL | Status: AC
  Administered 2019-07-09: 90 mL via ORAL

## 2019-07-09 MED ORDER — MORPHINE SULFATE (PF) 2 MG/ML IV SOLN: 1.0000 mg | INTRAVENOUS | Status: DC | PRN

## 2019-07-09 MED ORDER — ONDANSETRON 4 MG PO TBDP: 4.0000 mg | ORAL_TABLET | Freq: Three times a day (TID) | ORAL | Status: DC | PRN

## 2019-07-09 MED ORDER — ACETAMINOPHEN 325 MG PO TABS: 650.0000 mg | ORAL_TABLET | Freq: Four times a day (QID) | ORAL | Status: DC | PRN

## 2019-07-09 MED ORDER — POTASSIUM CHLORIDE 10 MEQ/100ML IV SOLN
10.0000 meq | INTRAVENOUS | Status: AC
  Administered 2019-07-09 (×3): 10 meq via INTRAVENOUS
  Filled 2019-07-09 (×2): qty 100

## 2019-07-09 MED ORDER — SODIUM CHLORIDE 0.9 % IV SOLN
INTRAVENOUS | Status: DC | PRN
  Administered 2019-07-09: 250 mL via INTRAVENOUS

## 2019-07-09 MED ORDER — TRAMADOL HCL 50 MG PO TABS: 50.0000 mg | ORAL_TABLET | Freq: Four times a day (QID) | ORAL | Status: DC | PRN

## 2019-07-09 NOTE — Progress Notes (Addendum)
Subjective:  CC: Julian Jennings is a 49 y.o. male  Hospital stay day 2,   SBO  HPI: Developed nausea with some clears yesterday.  Still passing flatus.   ROS:  General: Denies weight loss, weight gain, fatigue, fevers, chills, and night sweats. Heart: Denies chest pain, palpitations, racing heart, irregular heartbeat, leg pain or swelling, and decreased activity tolerance. Respiratory: Denies breathing difficulty, shortness of breath, wheezing, cough, and sputum. GI: Denies change in appetite, heartburn, nausea, vomiting, constipation, diarrhea, and blood in stool. GU: Denies difficulty urinating, pain with urinating, urgency, frequency, blood in urine.   Objective:   Temp:  [98.3 F (36.8 C)-99.8 F (37.7 C)] 98.3 F (36.8 C) (02/01 1157) Pulse Rate:  [83-88] 88 (02/01 1157) Resp:  [16-20] 18 (02/01 1157) BP: (126-127)/(74-80) 127/74 (02/01 1157) SpO2:  [97 %-100 %] 100 % (02/01 1157)     Height: 5\' 4"  (162.6 cm) Weight: 63.5 kg BMI (Calculated): 24.02   Intake/Output this shift:   Intake/Output Summary (Last 24 hours) at 07/09/2019 1445 Last data filed at 07/09/2019 0500 Gross per 24 hour  Intake 2000.11 ml  Output 0 ml  Net 2000.11 ml    Constitutional :  alert, cooperative, appears stated age and no distress  Respiratory:  clear to auscultation bilaterally  Cardiovascular:  regular rate and rhythm  Gastrointestinal: soft, no guarding, slightly increase? distention but no TTP.   Skin: Cool and moist.   Psychiatric: Normal affect, non-agitated, not confused       LABS:  CMP Latest Ref Rng & Units 07/09/2019 07/08/2019 07/08/2019  Glucose 70 - 99 mg/dL 07/10/2019) - -  BUN 6 - 20 mg/dL 17 - -  Creatinine 195(K - 1.24 mg/dL 9.32 - -  Sodium 6.71 - 145 mmol/L 134(L) - -  Potassium 3.5 - 5.1 mmol/L 3.1(L) 3.2(L) 2.8(L)  Chloride 98 - 111 mmol/L 99 - -  CO2 22 - 32 mmol/L 25 - -  Calcium 8.9 - 10.3 mg/dL 7.6(L) - -  Total Protein 6.5 - 8.1 g/dL - - -  Total Bilirubin 0.3 - 1.2 mg/dL -  - -  Alkaline Phos 38 - 126 U/L - - -  AST 15 - 41 U/L - - -  ALT 0 - 44 U/L - - -   CBC Latest Ref Rng & Units 07/09/2019 07/08/2019 07/08/2019  WBC 4.0 - 10.5 K/uL 4.8 2.2(L) 1.7(L)  Hemoglobin 13.0 - 17.0 g/dL 07/10/2019 80.9 98.3  Hematocrit 39.0 - 52.0 % 41.3 41.3 42.0  Platelets 150 - 400 K/uL 192 177 189    RADS: CLINICAL DATA:  Patient with abdominal cramping.  EXAM: ABDOMEN - 2 VIEW  COMPARISON:  Abdominal radiograph 130 2021  FINDINGS: Enteric tube tip and side-port project over the stomach. Lung bases are clear. Redemonstrated multiple gaseous distended loops of small bowel within the central abdomen with differential air-fluid levels on upright imaging. No free intraperitoneal air. Contrast material within the urinary bladder. Osseous structures unremarkable.  IMPRESSION: Findings compatible with small-bowel obstruction.   Electronically Signed   By: 2022 M.D.   On: 07/08/2019 08:14 Assessment:   SBO, inflammatory vs infectious stricturing of SB at transition point? Cont abx for possible infection.  Leukocytosis improving.   Now developed nausea again despite flatus, with worsening distention on exam.  Will order SBFT to see if persistent obstruction exists.  No need for NG yet since no emesis but will have low threshold to replace if needed.  NPO for now.

## 2019-07-09 NOTE — Progress Notes (Addendum)
Subjective: Was off of NG tube yesterday.  Has had soft bowel movement again this morning.  Denies any fever.  No hiccups since last night.  N.p.o. since this morning again.  Objective: Vital signs in last 24 hours: Temp:  [98.3 F (36.8 C)-99.8 F (37.7 C)] 98.3 F (36.8 C) (02/01 1157) Pulse Rate:  [83-88] 88 (02/01 1157) Resp:  [16-20] 18 (02/01 1157) BP: (126-127)/(74-80) 127/74 (02/01 1157) SpO2:  [97 %-100 %] 100 % (02/01 1157)  Intake/Output from previous day: 01/31 0701 - 02/01 0700 In: 2000.1 [P.O.:240; I.V.:992.1] Out: 150  Intake/Output this shift: No intake/output data recorded.  General  -mild distress with NG tube on Eyes: PERRL, lids and conjunctivae normal ENMT: Mucous membranes are moist. Posterior pharynx clear of any exudate or lesions.Normal dentition.  Neck: normal, supple, no masses, no thyromegaly Respiratory: clear to auscultation bilaterally, no wheezing, no crackles. Normal respiratory effort. No accessory muscle use.  Cardiovascular: Regular rate and rhythm, no murmurs / rubs / gallops. No extremity edema. 2+ pedal pulses. No carotid bruits.  Abdomen:  Hypoactive BS, slightly distended.  Mild  tenderness around the lower abdomen and surgical site. no masses palpated. No hepatosplenomegaly.  Musculoskeletal: no clubbing / cyanosis. No joint deformity upper and lower extremities. Good ROM, no contractures. Normal muscle tone.  Skin: no rashes, lesions, ulcers. No induration Neurologic: CN 2-12 grossly intact. Sensation intact, DTR normal. Strength 5/5 in all 4.  Psychiatric: Normal judgment and insight. Alert and oriented x 3. Normal mood.   Results for orders placed or performed during the hospital encounter of 07/07/19 (from the past 24 hour(s))  CBC with Differential/Platelet     Status: Abnormal   Collection Time: 07/08/19  3:12 PM  Result Value Ref Range   WBC 2.2 (L) 4.0 - 10.5 K/uL   RBC 4.29 4.22 - 5.81 MIL/uL   Hemoglobin 14.1 13.0 - 17.0 g/dL    HCT 41.3 39.0 - 52.0 %   MCV 96.3 80.0 - 100.0 fL   MCH 32.9 26.0 - 34.0 pg   MCHC 34.1 30.0 - 36.0 g/dL   RDW 12.3 11.5 - 15.5 %   Platelets 177 150 - 400 K/uL   nRBC 0.0 0.0 - 0.2 %   Neutrophils Relative % 65 %   Neutro Abs 1.5 (L) 1.7 - 7.7 K/uL   Lymphocytes Relative 17 %   Lymphs Abs 0.4 (L) 0.7 - 4.0 K/uL   Monocytes Relative 16 %   Monocytes Absolute 0.4 0.1 - 1.0 K/uL   Eosinophils Relative 0 %   Eosinophils Absolute 0.0 0.0 - 0.5 K/uL   Basophils Relative 1 %   Basophils Absolute 0.0 0.0 - 0.1 K/uL   RBC Morphology MORPHOLOGY UNREMARKABLE    Smear Review Normal platelet morphology    Immature Granulocytes 1 %   Abs Immature Granulocytes 0.02 0.00 - 0.07 K/uL   Reactive, Benign Lymphocytes PRESENT   Potassium     Status: Abnormal   Collection Time: 07/08/19  3:12 PM  Result Value Ref Range   Potassium 3.2 (L) 3.5 - 5.1 mmol/L  Magnesium     Status: None   Collection Time: 07/08/19  3:12 PM  Result Value Ref Range   Magnesium 1.7 1.7 - 2.4 mg/dL  Basic metabolic panel     Status: Abnormal   Collection Time: 07/09/19  9:05 AM  Result Value Ref Range   Sodium 134 (L) 135 - 145 mmol/L   Potassium 3.1 (L) 3.5 - 5.1 mmol/L  Chloride 99 98 - 111 mmol/L   CO2 25 22 - 32 mmol/L   Glucose, Bld 119 (H) 70 - 99 mg/dL   BUN 17 6 - 20 mg/dL   Creatinine, Ser 1.61 0.61 - 1.24 mg/dL   Calcium 7.6 (L) 8.9 - 10.3 mg/dL   GFR calc non Af Amer >60 >60 mL/min   GFR calc Af Amer >60 >60 mL/min   Anion gap 10 5 - 15  CBC     Status: None   Collection Time: 07/09/19  9:05 AM  Result Value Ref Range   WBC 4.8 4.0 - 10.5 K/uL   RBC 4.31 4.22 - 5.81 MIL/uL   Hemoglobin 14.4 13.0 - 17.0 g/dL   HCT 09.6 04.5 - 40.9 %   MCV 95.8 80.0 - 100.0 fL   MCH 33.4 26.0 - 34.0 pg   MCHC 34.9 30.0 - 36.0 g/dL   RDW 81.1 91.4 - 78.2 %   Platelets 192 150 - 400 K/uL   nRBC 0.0 0.0 - 0.2 %    Studies/Results: DG Abdomen 1 View  Result Date: 07/07/2019 CLINICAL DATA:  NG tube placement  EXAM: ABDOMEN - 1 VIEW COMPARISON:  None. FINDINGS: The NG tube projects over the left upper quadrant in the region of the patient's gastric body or fundus. Dilated loops of small bowel are again noted consistent with the patient's small-bowel obstruction. IMPRESSION: 1. NG tube tip projects over the gastric body or fundus. 2. Small bowel obstruction. Electronically Signed   By: Katherine Mantle M.D.   On: 07/07/2019 19:25   CT ABDOMEN PELVIS W CONTRAST  Result Date: 07/07/2019 CLINICAL DATA:  Sharp abdominal pain and nausea with vomiting for 3 days. EXAM: CT ABDOMEN AND PELVIS WITH CONTRAST TECHNIQUE: Multidetector CT imaging of the abdomen and pelvis was performed using the standard protocol following bolus administration of intravenous contrast. CONTRAST:  OMNIPAQUE IOHEXOL 300 MG/ML  SOLN COMPARISON:  None. FINDINGS: Lower Chest: No acute findings. Hepatobiliary: Tiny sub-cm low-attenuation lesion in the in medial segment of left lobe is too small to characterize but most likely represents a tiny cyst. No other liver lesions identified. Gallbladder is unremarkable. No evidence of biliary ductal dilatation. Pancreas:  No mass or inflammatory changes. Spleen: Within normal limits in size and appearance. Adrenals/Urinary Tract: No masses identified. Mild left renal parenchymal scarring noted. No evidence of hydronephrosis. Stomach/Bowel: Multiple moderately dilated fluid-filled small bowel loops are seen with transition point in the right lower quadrant, consistent with distal small bowel obstruction. Mild small bowel wall thickening and enhancement in the region of the transition point is consistent with enteritis. This may be due to stricture or adhesion. No evidence of abscess. Minimal free fluid seen in pelvic cul-de-sac. Vascular/Lymphatic: No pathologically enlarged lymph nodes. Shotty sub-cm mesenteric lymph nodes in the right abdomen are likely reactive in etiology. No abdominal aortic aneurysm.  Reproductive:  No mass or other significant abnormality. Other:  None. Musculoskeletal:  No suspicious bone lesions identified. IMPRESSION: 1. Distal small bowel obstruction, with transition point in the right lower quadrant. This may be due to stricture or adhesion. 2. Minimal free fluid in pelvic cul-de-sac. No evidence of abscess. Electronically Signed   By: Danae Orleans M.D.   On: 07/07/2019 17:08   DG Abd 2 Views  Result Date: 07/08/2019 CLINICAL DATA:  Patient with abdominal cramping. EXAM: ABDOMEN - 2 VIEW COMPARISON:  Abdominal radiograph 130 2021 FINDINGS: Enteric tube tip and side-port project over the stomach. Lung bases are  clear. Redemonstrated multiple gaseous distended loops of small bowel within the central abdomen with differential air-fluid levels on upright imaging. No free intraperitoneal air. Contrast material within the urinary bladder. Osseous structures unremarkable. IMPRESSION: Findings compatible with small-bowel obstruction. Electronically Signed   By: Annia Belt M.D.   On: 07/08/2019 08:14    Scheduled Meds: . diatrizoate meglumine-sodium  90 mL Oral Once  . enoxaparin (LOVENOX) injection  40 mg Subcutaneous Q24H  . pantoprazole (PROTONIX) IV  40 mg Intravenous Q12H   Continuous Infusions: . dextrose 5 % and 0.45 % NaCl with KCl 40 mEq/L 50 mL/hr at 07/09/19 0958  . piperacillin-tazobactam 3.375 g (07/09/19 0448)  . potassium chloride     PRN Meds:acetaminophen, morphine injection, ondansetron, traMADol, traZODone  Assessment/Plan:  1. SBO - has had 2 bowel movements including this morning since last morning -Currently off of NG tube -However mild distention noticed on abdominal exam.  Will have an small bowel follow-through this afternoon.  Patient already drank the contrast material this morning. -We will follow further's surgery recs.  Appreciated -Continue n.p.o. status for now -  sponataneous SBO. No prior surgery, no GI history, no injury. He is basically  a healthy man.  - Cont IV PPI      - Cont VF - D5 1/2NS -Electrolyte replacement as appropriate   - Cont  Ketorolac and Tylenol PR PRN for pain mgt - Zosyn 3.375mg  IV q 6 for potential intra-abdominal infection - BCx X 2 so far negative (ordered on 07/08/19)    Electrolyte imbalance: Hypokalemic and hypomagnesemic -Currently receiving replacement -Repeat lab  DVT prophylaxis: lovenox  Code Status: full code   Family Communication: Brother Trey Paula [phone]  Disposition Plan: home when medically stable  Admission status: inpatient     LOS: 2 days   Julian Jennings Harold Hedge

## 2019-07-10 LAB — BASIC METABOLIC PANEL
Anion gap: 10 (ref 5–15)
BUN: 11 mg/dL (ref 6–20)
CO2: 22 mmol/L (ref 22–32)
Calcium: 7.9 mg/dL — ABNORMAL LOW (ref 8.9–10.3)
Chloride: 103 mmol/L (ref 98–111)
Creatinine, Ser: 0.82 mg/dL (ref 0.61–1.24)
GFR calc Af Amer: >60 mL/min (ref >60)
GFR calc non Af Amer: >60 mL/min (ref >60)
Glucose, Bld: 128 mg/dL — ABNORMAL HIGH (ref 70–99)
Potassium: 3.6 mmol/L (ref 3.5–5.1)
Sodium: 135 mmol/L (ref 135–145)

## 2019-07-10 LAB — MAGNESIUM: Magnesium: 2.4 mg/dL (ref 1.7–2.4)

## 2019-07-10 MED ORDER — ACIDOPHILUS 0.5 MG PO TABS: 1.0000 | ORAL_TABLET | Freq: Two times a day (BID) | ORAL | 0 refills | Status: AC

## 2019-07-10 MED ORDER — ONDANSETRON 4 MG PO TBDP: 4.0000 mg | ORAL_TABLET | Freq: Three times a day (TID) | ORAL | 0 refills | Status: AC | PRN

## 2019-07-10 MED ORDER — TRAZODONE HCL 50 MG PO TABS: 50.0000 mg | ORAL_TABLET | Freq: Every evening | ORAL | 0 refills | Status: AC | PRN

## 2019-07-10 MED ORDER — AMOXICILLIN-POT CLAVULANATE 875-125 MG PO TABS: 1.0000 | ORAL_TABLET | Freq: Two times a day (BID) | ORAL | 0 refills | Status: AC

## 2019-07-10 MED ORDER — ACETAMINOPHEN 325 MG PO TABS: 650.0000 mg | ORAL_TABLET | Freq: Four times a day (QID) | ORAL | 0 refills | Status: AC | PRN

## 2019-07-10 NOTE — Discharge Summary (Signed)
Physician Discharge Summary  Patient ID: Julian Jennings MRN: [id number] DOB/AGE: 1970-11-01 49 y.o.  Admit date: 07/07/2019 Discharge date: 07/10/2019  Admission Diagnoses:  Discharge Diagnoses:  Active Problems:   SBO (small bowel obstruction) (HCC)   Discharged Condition: fair  Hospital Course:  49 y.o. male with medical history significant of  presents the emergency department complaining of sharp midabdominal/periumbilical pain. Patient states that the pain is intense, the worst he has ever had in his abdomen. Patient states that he has been extremely nauseated, having emesis. He has not been able to keep solids or liquids down in 3 days. Patient states that he is having no flatulence or bowel movements in the last 4 days. No history of chronic abdominal problems. No history of colitis, Crohn's, IBS. No history of abdominal surgery or injury. Imaging reveals SBO distal small bowel right. NG placed with some relief of pain and distention. Surgical consult obtained.    SBO -mostly resolved -Patient has had multiple bowel movements and tolerated solid diet well -Taken off of NG tube -Denied any more pain -Small bowel follow-through on 07/09/2019 - films show contrast in colon -As tolerated solid diet well. -Surgery cleared for discharge if patient continues to tolerate solid diet and remains symptom-free -Was receiving Zosyn 3.375mg  IV q 6 for potential intra-abdominal infection; on discharge sent home with Augmentin for another week - BCx X 2 so far negative (ordered on 07/08/19)  -Encouraged strongly to follow-up with PCP as soon as possible and may need to repeat labs and referral to a specialist such as general surgery and follow-up imaging.  Electrolyte imbalance: Resolved hypokalemic and hypomagnesemic -Status post receiving replacement  Consults: general surgery  Significant Diagnostic Studies: Imaging and blood work  Treatments: Hospital course and discharge med  list  Discharge Exam: Blood pressure 121/86, pulse 70, temperature 98.1 F (36.7 C), temperature source Oral, resp. rate 18, height 5\' 4"  (1.626 m), weight 63.5 kg, SpO2 98 %.  Constitutional :  alert, cooperative, appears stated age and no distress  Respiratory:  clear to auscultation bilaterally  Cardiovascular:  regular rate and rhythm  Gastrointestinal: soft, no guarding, decreased distention compared to prior exam distention and no TTP.   Skin: Cool and moist.   Psychiatric: Normal affect, non-agitated, not confused     Disposition: Discharge disposition: 01-Home or Self Care     Stable to be discharged home with close follow-up with PCP.  Warning signs and symptoms explained to patient when he must seek immediate medical attention.  Patient expressed understanding. -Patient is from and expressed wishes to return home as soon as possible.  Discharge Instructions    Call MD for:  persistant nausea and vomiting   Complete by: As directed    Call MD for:  severe uncontrolled pain   Complete by: As directed    Call MD for:  temperature >100.4   Complete by: As directed    Diet - low sodium heart healthy   Complete by: As directed    Increase activity slowly   Complete by: As directed      Allergies as of 07/10/2019   No Known Allergies     Medication List    TAKE these medications   acetaminophen 325 MG tablet Commonly known as: TYLENOL Take 2 tablets (650 mg total) by mouth every 6 (six) hours as needed for mild pain or fever.   Acidophilus 0.5 MG Tabs Take 1 capsule by mouth 2 (two) times daily.   amoxicillin-clavulanate 875-125  MG tablet Commonly known as: Augmentin Take 1 tablet by mouth 2 (two) times daily for 7 days.   ondansetron 4 MG disintegrating tablet Commonly known as: ZOFRAN-ODT Take 1 tablet (4 mg total) by mouth every 8 (eight) hours as needed for nausea or vomiting.   traZODone 50 MG tablet Commonly known as: DESYREL Take 1 tablet  (50 mg total) by mouth at bedtime as needed for sleep.      Follow-up Information    PCP. Schedule an appointment as soon as possible for a visit in 1 week.   Why: F/U PCP in 7-10 days  Contact information: Per Patient ([place] of state[place]          Signed: Thornell Mule 07/10/2019, 1:54 PM

## 2019-07-10 NOTE — Progress Notes (Signed)
Subjective:  CC: Julian Jennings is a 49 y.o. male  Hospital stay day 3,   SBO  HPI: Feeling better again today.  Continues to have bowel movements and tolerating clear liquid diet.   ROS:  General: Denies weight loss, weight gain, fatigue, fevers, chills, and night sweats. Heart: Denies chest pain, palpitations, racing heart, irregular heartbeat, leg pain or swelling, and decreased activity tolerance. Respiratory: Denies breathing difficulty, shortness of breath, wheezing, cough, and sputum. GI: Denies change in appetite, heartburn, nausea, vomiting, constipation, diarrhea, and blood in stool. GU: Denies difficulty urinating, pain with urinating, urgency, frequency, blood in urine.   Objective:   Temp:  [98.3 F (36.8 C)-99 F (37.2 C)] 98.4 F (36.9 C) (02/02 0455) Pulse Rate:  [69-88] 69 (02/02 0455) Resp:  [18-20] 18 (02/02 0455) BP: (125-130)/(74-87) 125/87 (02/02 0455) SpO2:  [98 %-100 %] 100 % (02/02 0455)     Height: 5\' 4"  (162.6 cm) Weight: 63.5 kg BMI (Calculated): 24.02   Intake/Output this shift:  No intake or output data in the 24 hours ending 07/10/19 0754  Constitutional :  alert, cooperative, appears stated age and no distress  Respiratory:  clear to auscultation bilaterally  Cardiovascular:  regular rate and rhythm  Gastrointestinal: soft, no guarding, decreased distention compared to prior exam distention and no TTP.   Skin: Cool and moist.   Psychiatric: Normal affect, non-agitated, not confused       LABS:  CMP Latest Ref Rng & Units 07/09/2019 07/08/2019 07/08/2019  Glucose 70 - 99 mg/dL 07/10/2019) - -  BUN 6 - 20 mg/dL 17 - -  Creatinine 725(D - 1.24 mg/dL 6.64 - -  Sodium 4.03 - 145 mmol/L 134(L) - -  Potassium 3.5 - 5.1 mmol/L 3.1(L) 3.2(L) 2.8(L)  Chloride 98 - 111 mmol/L 99 - -  CO2 22 - 32 mmol/L 25 - -  Calcium 8.9 - 10.3 mg/dL 7.6(L) - -  Total Protein 6.5 - 8.1 g/dL - - -  Total Bilirubin 0.3 - 1.2 mg/dL - - -  Alkaline Phos 38 - 126 U/L - - -  AST 15 -  41 U/L - - -  ALT 0 - 44 U/L - - -   CBC Latest Ref Rng & Units 07/09/2019 07/08/2019 07/08/2019  WBC 4.0 - 10.5 K/uL 4.8 2.2(L) 1.7(L)  Hemoglobin 13.0 - 17.0 g/dL 07/10/2019 25.9 56.3  Hematocrit 39.0 - 52.0 % 41.3 41.3 42.0  Platelets 150 - 400 K/uL 192 177 189    RADS: CLINICAL DATA:  8 hour delay following oral contrast administration, value 8 for small-bowel obstruction  EXAM: PORTABLE ABDOMEN - 1 VIEW  COMPARISON:  CT study of July 07, 2019 is and plain film of July 08, 2019  FINDINGS: Marked distension of small bowel loops persists. However, since the previous study there has been interval passage of positive contrast into the colon to the level of the rectum.  No signs of acute bone process.  IMPRESSION: Marked distension of small bowel loops persists. However, there has been interval passage of positive contrast into the colon to the level of the rectum. Findings are compatible with partial small bowel obstruction. Note that caliber small bowel loops nears 5 cm, similar to prior studyw.   Electronically Signed   By: July 10, 2019 M.D.   On: 07/09/2019 19:29 Assessment:   SBO, inflammatory vs infectious stricturing of SB at transition point?   Symptoms improving again.  Will advance to regular diet and see how he tolerates.  Still unsure what the exact cause of his symptoms are at this point.  Will defer to primary if further work-up as an inpatient is necessary.  [place] and inquiring when he will be able to be discharged.  I told him that they will be up to his admitting provider and that he will be able to take care of any work-related paperwork.  Once patient is tolerating a regular diet surgery will sign off.

## 2019-07-10 NOTE — Progress Notes (Signed)
Pt discharged per MD order. IVs removed. Discharge instructions reviewed with pt. Pt verbalized understanding. Pt declined wheelchair and walked himself downstairs.

## 2019-07-10 NOTE — Discharge Instructions (Signed)
Bowel Obstruction °A bowel obstruction means that something is blocking the small or large bowel. The bowel is also called the intestine. It is the long tube that connects the stomach to the opening of the butt (anus). When something blocks the bowel, food and fluids cannot pass through like normal. This condition needs to be treated. Treatment depends on the cause of the problem and how bad the problem is. °What are the causes? °Common causes of this condition include: °· Scar tissue (adhesions) from past surgery or from high-energy X-rays (radiation). °· Recent surgery in the belly. This affects how food moves in the bowel. °· Some diseases, such as: °? Irritation of the lining of the digestive tract (Crohn's disease). °? Irritation of small pouches in the bowel (diverticulitis). °· Growths or tumors. °· A bulging organ (hernia). °· Twisting of the bowel (volvulus). °· A foreign body. °· Slipping of a part of the bowel into another part (intussusception). °What are the signs or symptoms? °Symptoms of this condition include: °· Pain in the belly. °· Feeling sick to your stomach (nauseous). °· Throwing up (vomiting). °· Bloating in the belly. °· Being unable to pass gas. °· Trouble pooping (constipation). °· Watery poop (diarrhea). °· A lot of belching. °How is this diagnosed? °This condition may be diagnosed based on: °· A physical exam. °· Medical history. °· Imaging tests, such as X-ray or CT scan. °· Blood tests. °· Urine tests. °How is this treated? °Treatment for this condition may include: °· Fluids and pain medicines that are given through an IV tube. Your doctor may tell you not to eat or drink if you feel sick to your stomach and are throwing up. °· Eating a clear liquid diet for a few days. °· Putting a small tube (nasogastric tube) into the stomach. This will help with pain, discomfort, and nausea by removing blocked air and fluids from the stomach. °· Surgery. This may be needed if other treatments do  not work. °Follow these instructions at home: °Medicines °· Take over-the-counter and prescription medicines only as told by your doctor. °· If you were prescribed an antibiotic medicine, take it as told by your doctor. Do not stop taking the antibiotic even if you start to feel better. °General instructions °· Follow your diet as told by your doctor. You may need to: °? Only drink clear liquids until you start to get better. °? Avoid solid foods. °· Return to your normal activities as told by your doctor. Ask your doctor what activities are safe for you. °· Do not sit for a long time without moving. Get up to take short walks every 1-2 hours. This is important. Ask for help if you feel weak or unsteady. °· Keep all follow-up visits as told by your doctor. This is important. °How is this prevented? °After having a bowel obstruction, you may be more likely to have another. You can do some things to stop it from happening again. °· If you have a long-term (chronic) disease, contact your doctor if you see changes or problems. °· Take steps to prevent or treat trouble pooping. Your doctor may ask that you: °? Drink enough fluid to keep your pee (urine) pale yellow. °? Take over-the-counter or prescription medicines. °? Eat foods that are high in fiber. These include beans, whole grains, and fresh fruits and vegetables. °? Limit foods that are high in fat and sugar. These include fried or sweet foods. °· Stay active. Ask your doctor which exercises are   safe for you. °· Avoid stress. °· Eat three small meals and three small snacks each day. °· Work with a food expert (dietitian) to make a meal plan that works for you. °· Do not use any products that contain nicotine or tobacco, such as cigarettes and e-cigarettes. If you need help quitting, ask your doctor. °Contact a doctor if: °· You have a fever. °· You have chills. °Get help right away if: °· You have pain or cramps that get worse. °· You throw up blood. °· You are  sick to your stomach. °· You cannot stop throwing up. °· You cannot drink fluids. °· You feel mixed up (confused). °· You feel very thirsty (dehydrated). °· Your belly gets more bloated. °· You feel weak or you pass out (faint). °Summary °· A bowel obstruction means that something is blocking the small or large bowel. °· Treatment may include IV fluids and pain medicine. You may also have a clear liquid diet, a small tube in your stomach, or surgery. °· Drink clear liquids and avoid solid foods until you get better. °This information is not intended to replace advice given to you by your health care provider. Make sure you discuss any questions you have with your health care provider. °Document Revised: 10/05/2017 Document Reviewed: 10/05/2017 °[employer]vier Patient Education © 2020 [employer] ° °

## 2019-07-13 LAB — CULTURE, BLOOD (ROUTINE X 2)
Culture: NO GROWTH
Culture: NO GROWTH
Special Requests: ADEQUATE
Special Requests: ADEQUATE

## 2021-02-28 IMAGING — CT CT ABD-PELV W/ CM
2 of 5 series · 15 of 46 positions shown, 17 images · IV contrast (APPLIED)
Comparison: None.

CLINICAL DATA: Sharp abdominal pain and nausea with vomiting for 3
days.

EXAM:
CT ABDOMEN AND PELVIS WITH CONTRAST
TECHNIQUE: Multidetector CT imaging of the abdomen and pelvis was performed
using the standard protocol following bolus administration of
intravenous contrast.
CONTRAST:  100mL OMNIPAQUE IOHEXOL 300 MG/ML  SOLN

[Series 2: routine abd/pel with · axial · 0.76mm/px · z∈[-510,-96]mm · 12 of 95 slices shown, 14 images]
[im 6/95  soft-tissue]
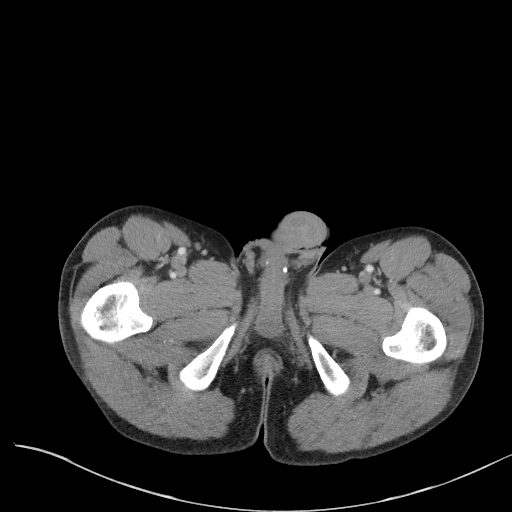
[im 6/95  bone]
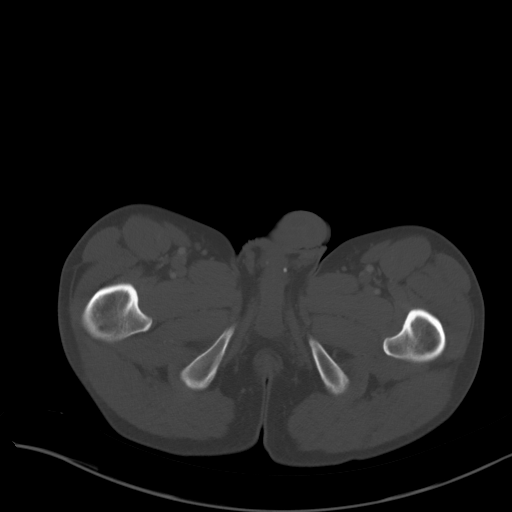
[im 16/95  soft-tissue]
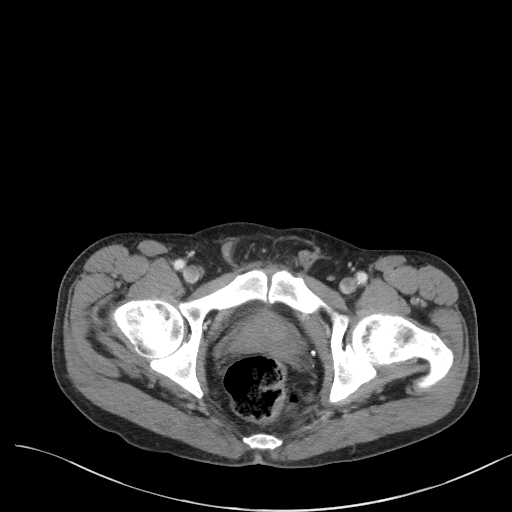
[im 21/95  soft-tissue]
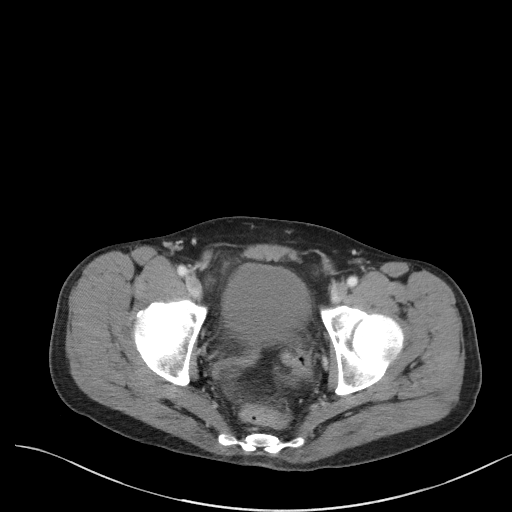
[im 27/95  soft-tissue]
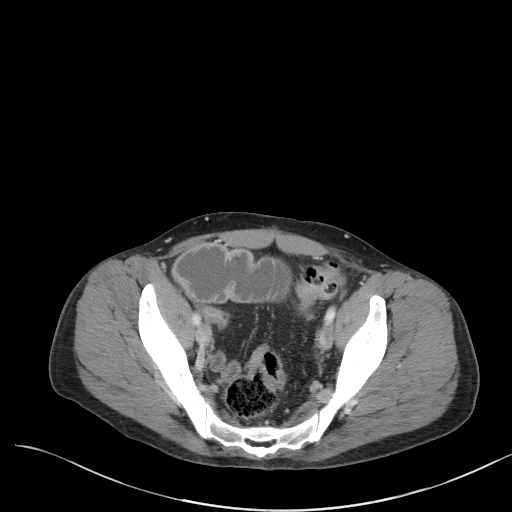
[im 37/95  soft-tissue]
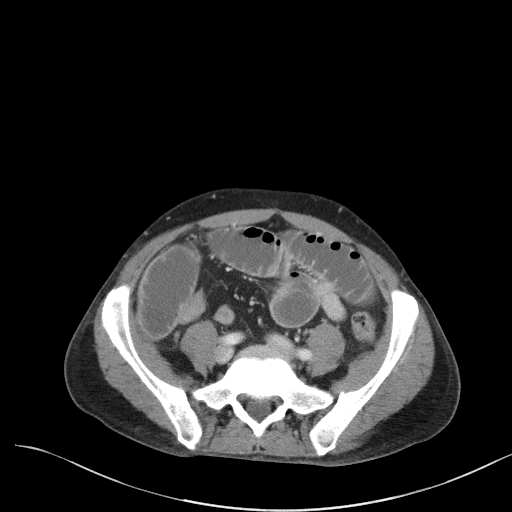
[im 42/95  soft-tissue]
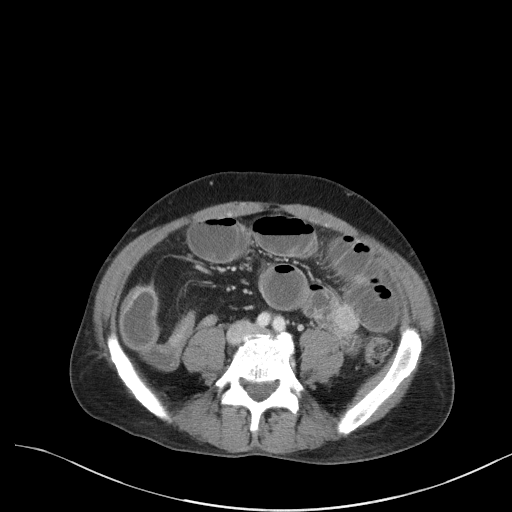
[im 53/95  soft-tissue]
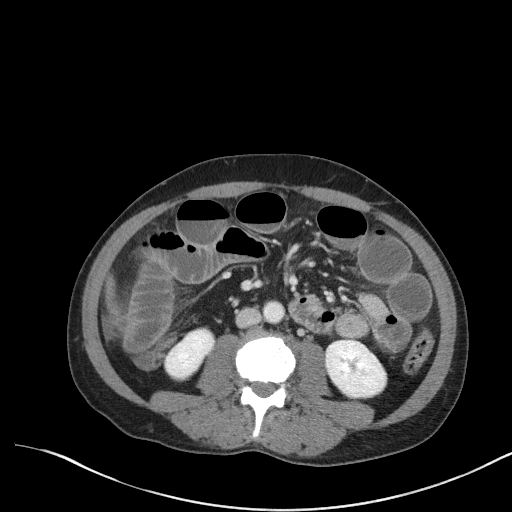
[im 58/95  soft-tissue]
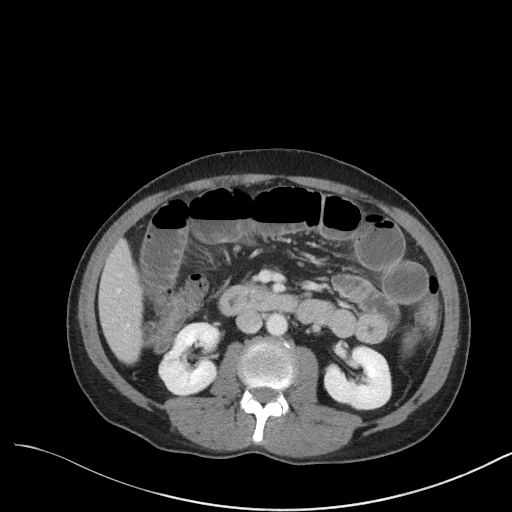
[im 68/95  soft-tissue]
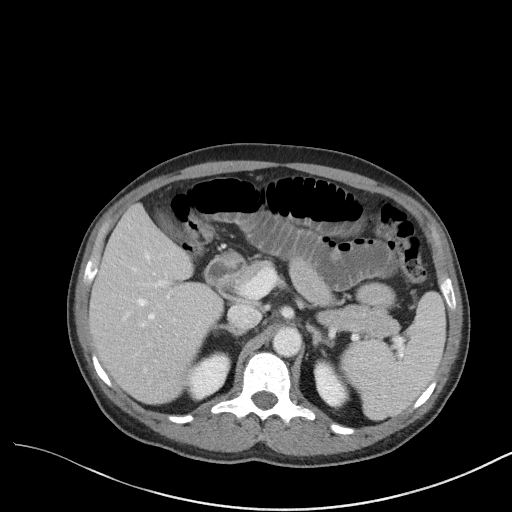
[im 68/95  bone]
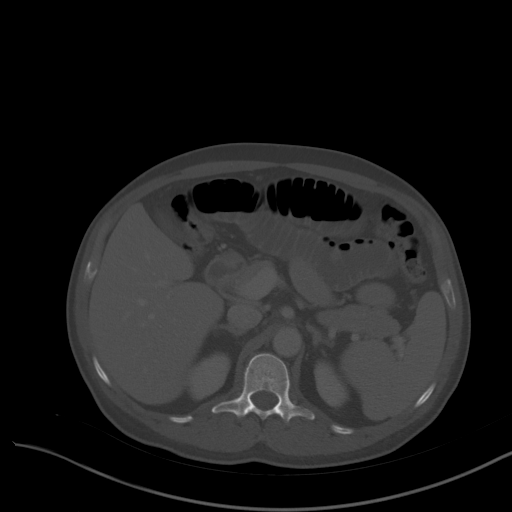
[im 74/95  soft-tissue]
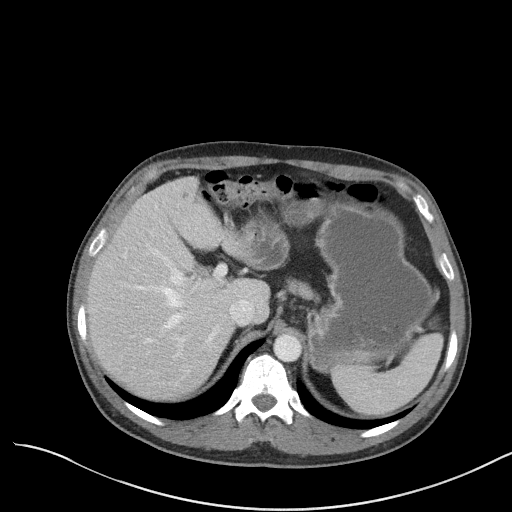
[im 79/95  soft-tissue]
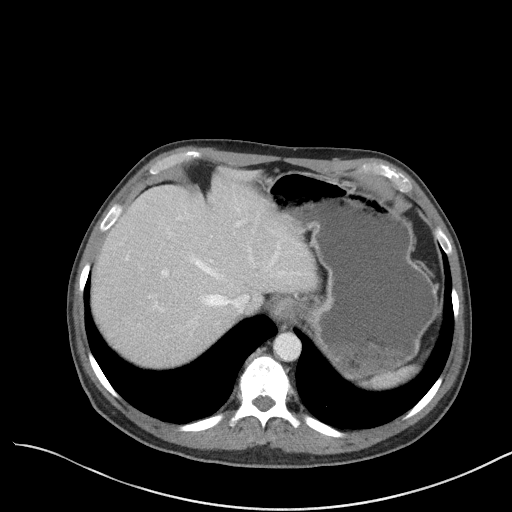
[im 89/95  soft-tissue]
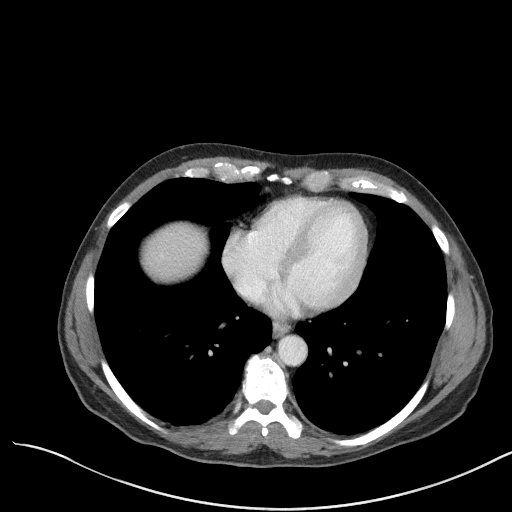

[Series 5: coronal st · coronal · 0.65mm/px · 3 of 85 slices shown]
[im 29/85  soft-tissue]
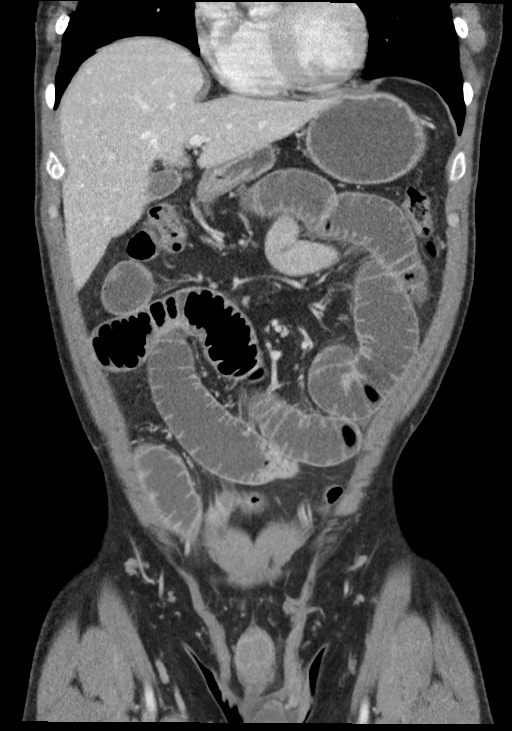
[im 38/85  soft-tissue]
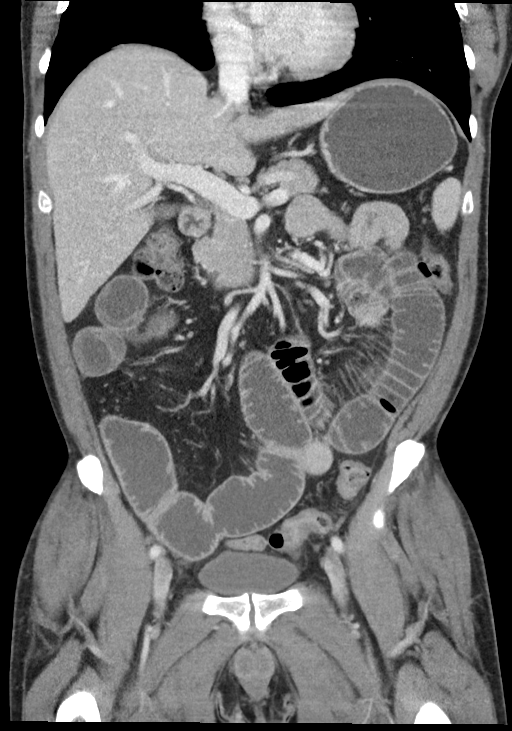
[im 47/85  soft-tissue]
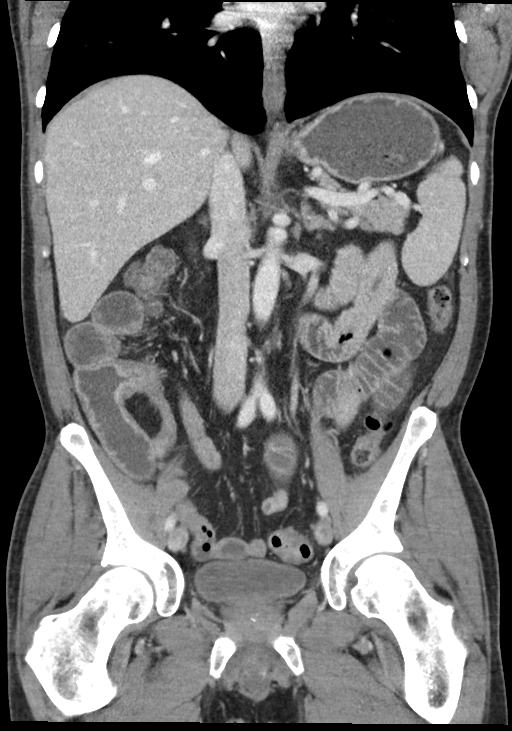

[15 of 46 positions shown; findings below may reference images not displayed]

FINDINGS: Lower Chest: No acute findings.

Hepatobiliary: Tiny sub-cm low-attenuation lesion in the in medial
segment of left lobe is too small to characterize but most likely
represents a tiny cyst. No other liver lesions identified.
Gallbladder is unremarkable. No evidence of biliary ductal
dilatation.

Pancreas:  No mass or inflammatory changes.

Spleen: Within normal limits in size and appearance.

Adrenals/Urinary Tract: No masses identified. Mild left renal
parenchymal scarring noted. No evidence of hydronephrosis.

Stomach/Bowel: Multiple moderately dilated fluid-filled small bowel
loops are seen with transition point in the right lower quadrant,
consistent with distal small bowel obstruction. Mild small bowel
wall thickening and enhancement in the region of the transition
point is consistent with enteritis. This may be due to stricture or
adhesion. No evidence of abscess. Minimal free fluid seen in pelvic
cul-de-sac.

Vascular/Lymphatic: No pathologically enlarged lymph nodes. Shotty
sub-cm mesenteric lymph nodes in the right abdomen are likely
reactive in etiology. No abdominal aortic aneurysm.

Reproductive:  No mass or other significant abnormality.

Other:  None.

Musculoskeletal:  No suspicious bone lesions identified.
IMPRESSION: 1. Distal small bowel obstruction, with transition point in the
right lower quadrant. This may be due to stricture or adhesion.
2. Minimal free fluid in pelvic cul-de-sac. No evidence of abscess.

## 2021-03-01 IMAGING — CR DG ABDOMEN 2V
1 series · 3 of 3 positions shown · non-contrast
Comparison: Abdominal radiograph [DATE]

CLINICAL DATA: Patient with abdominal cramping.

EXAM:
ABDOMEN - 2 VIEW

[Series 1: dg abd 2 views · 0.14mm/px · 3 of 3 slices shown]
[im 1/3]
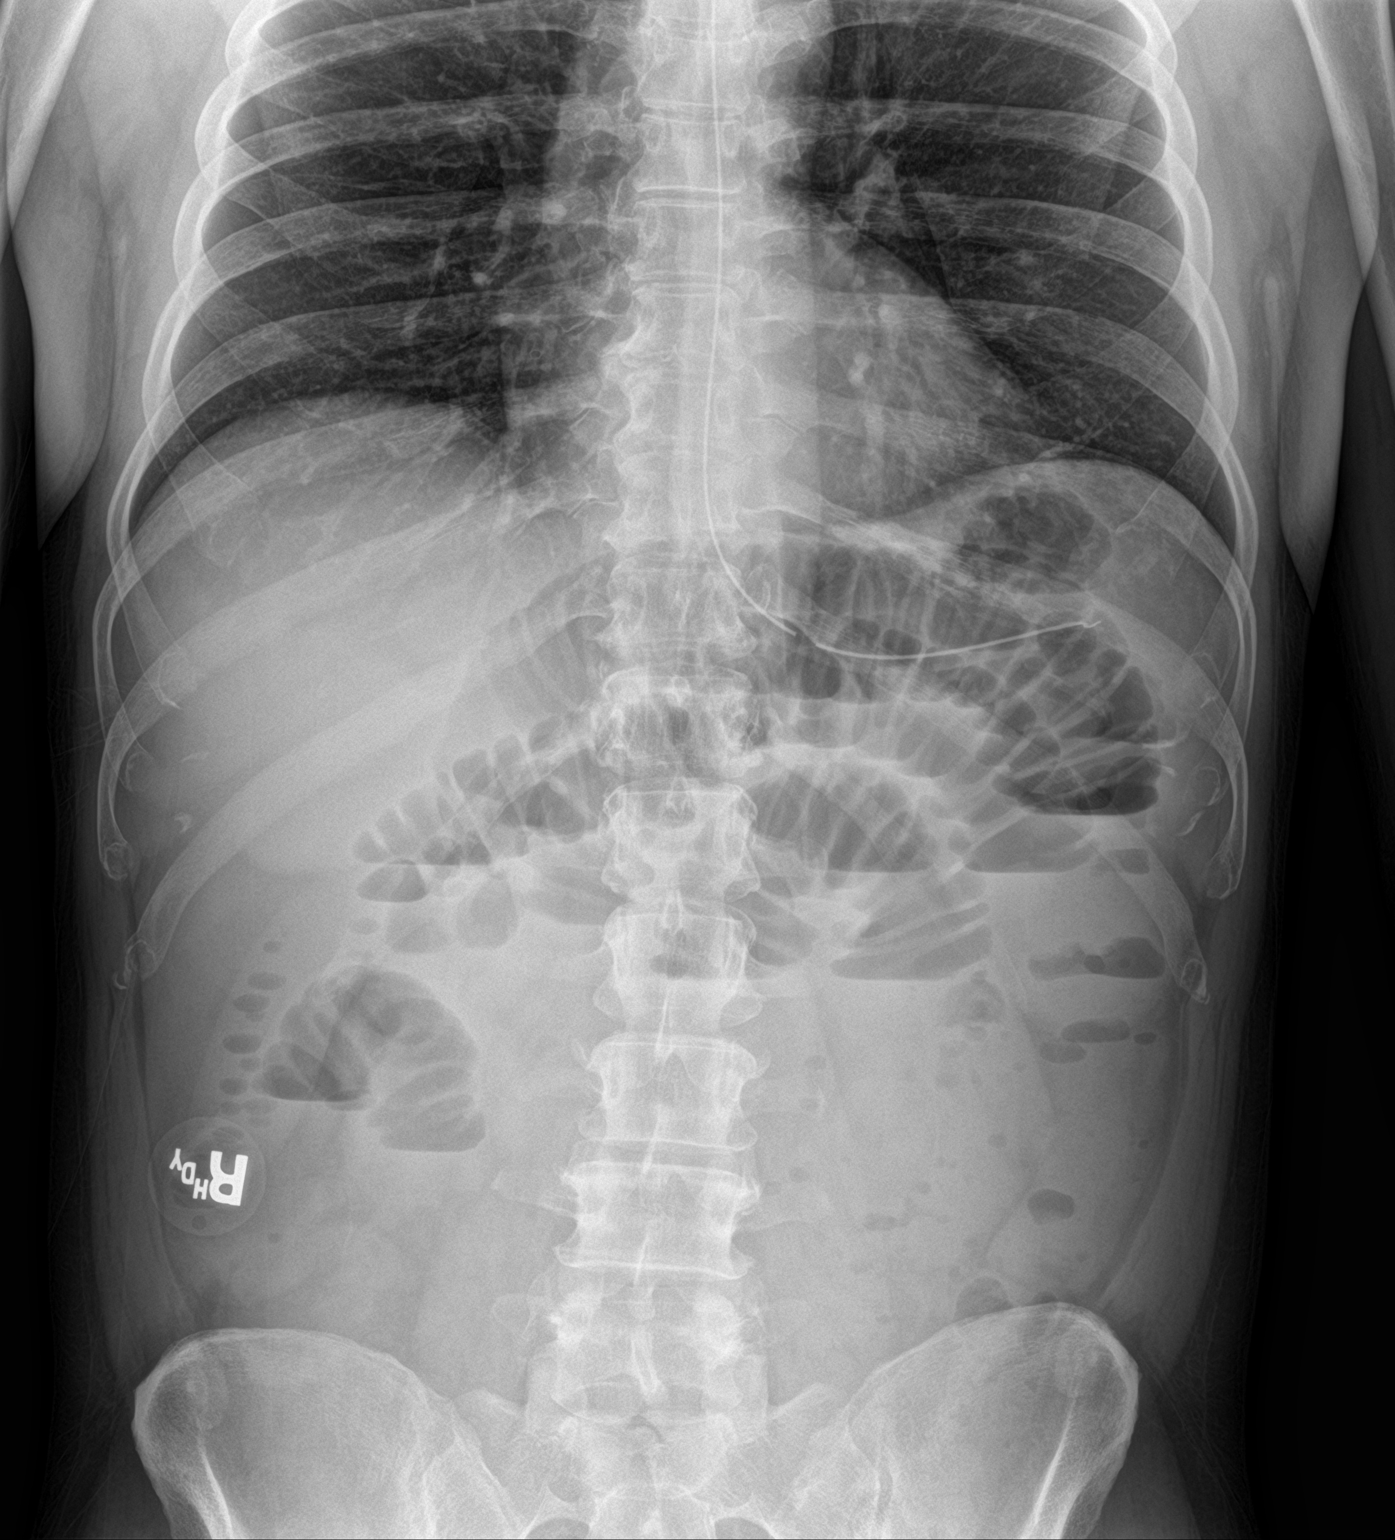
[im 2/3]
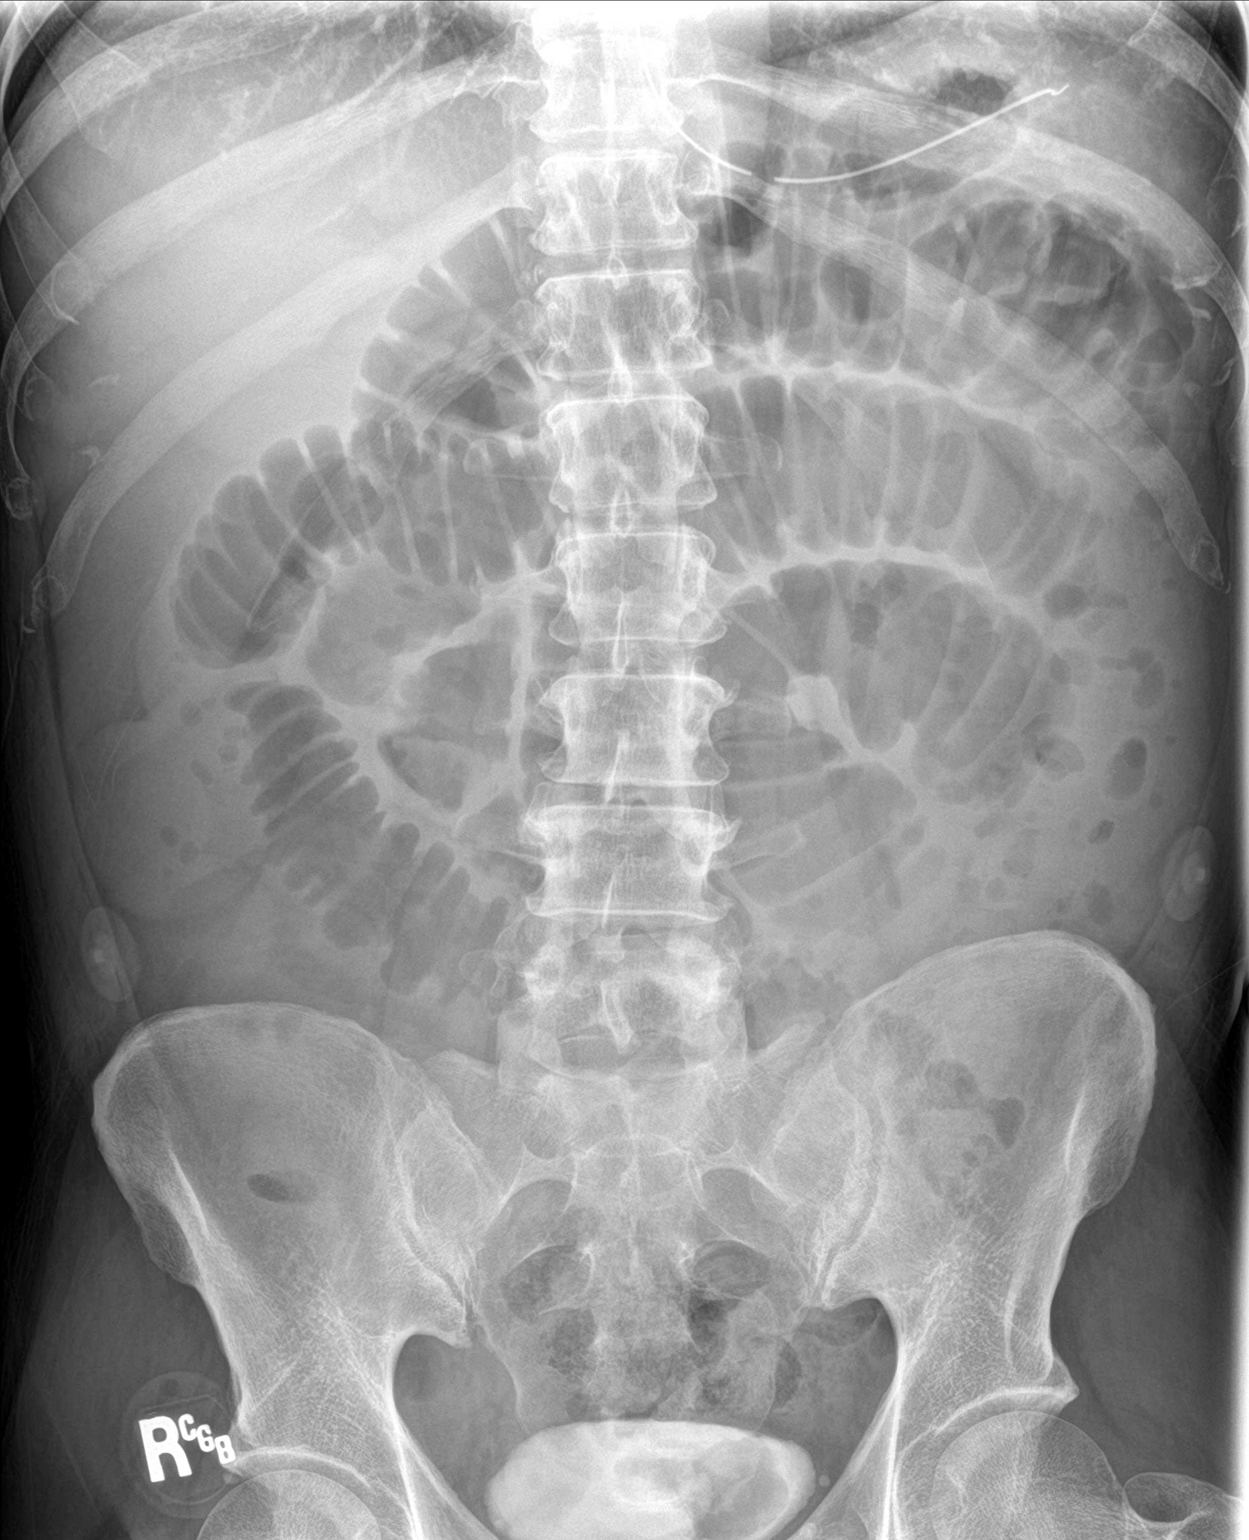
[im 3/3]
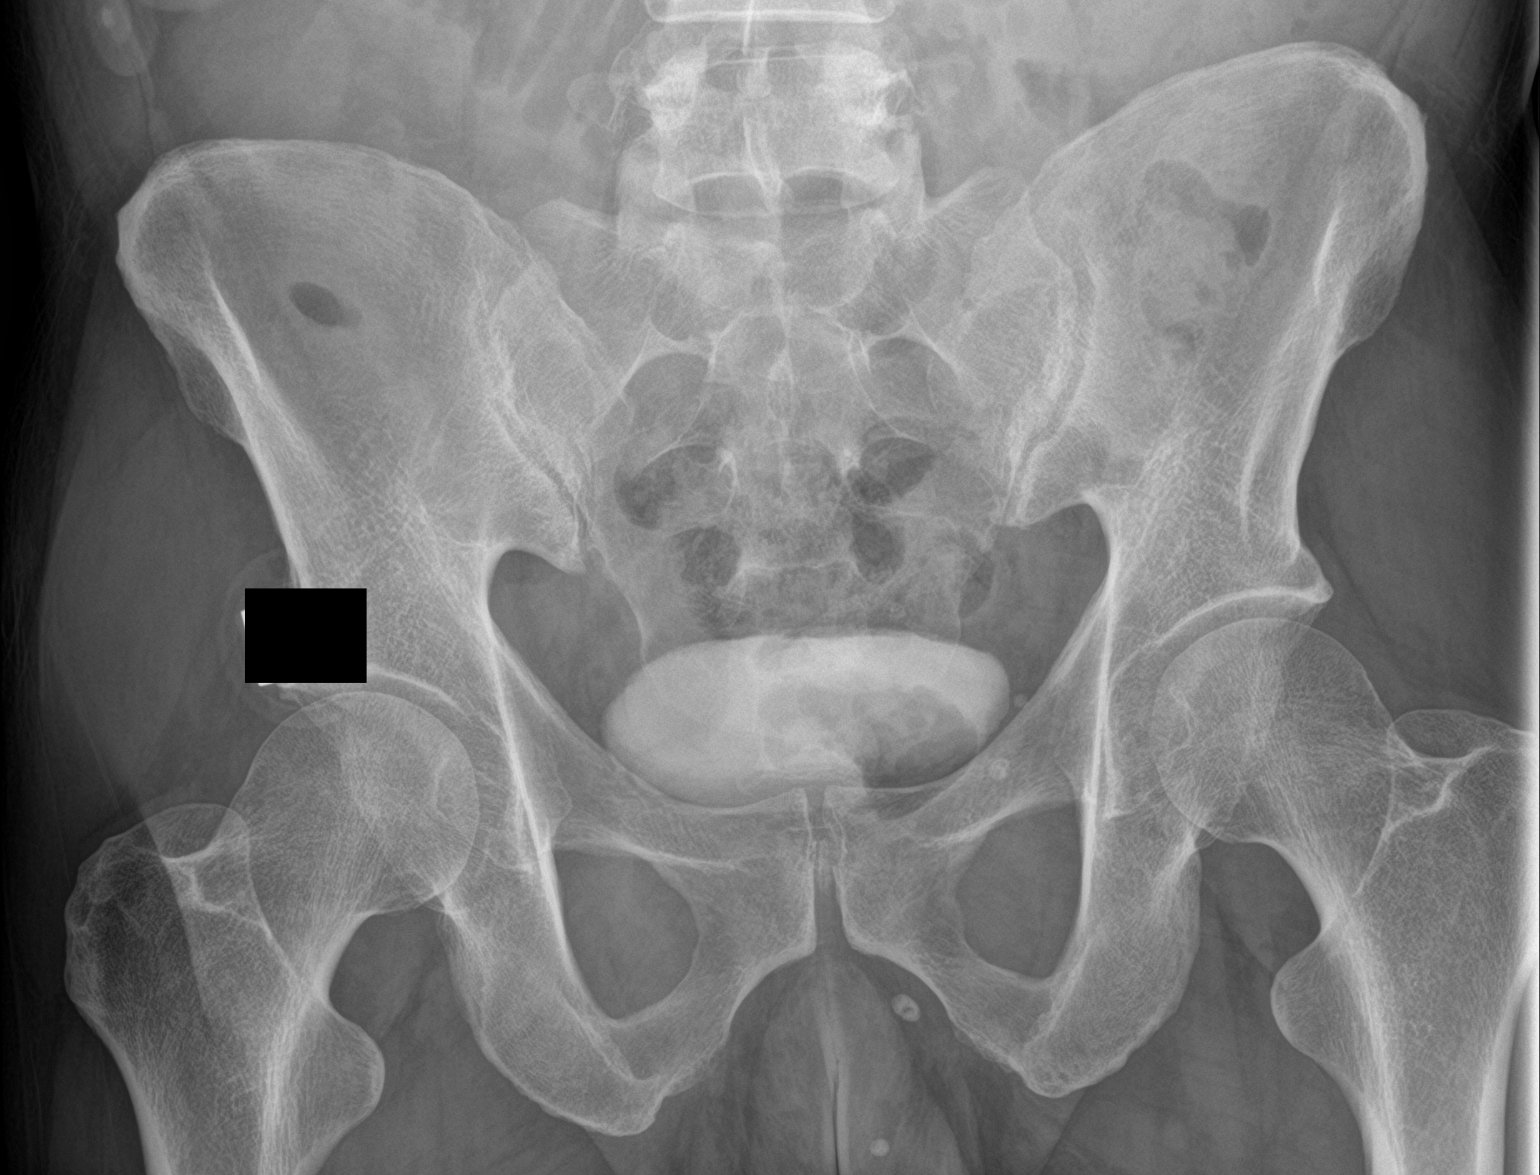

[3 of 3 positions shown; findings below may reference images not displayed]

FINDINGS: Enteric tube tip and side-port project over the stomach. Lung bases
are clear. Redemonstrated multiple gaseous distended loops of small
bowel within the central abdomen with differential air-fluid levels
on upright imaging. No free intraperitoneal air. Contrast material
within the urinary bladder. Osseous structures unremarkable.
IMPRESSION: Findings compatible with small-bowel obstruction.
